# Patient Record
Sex: Female | Born: 1985 | Race: Black or African American | Hispanic: No | Marital: Single | Smoking: Never smoker
Health system: Southern US, Community
[De-identification: ages and names within clinical notes are randomized; demographics above are authoritative.]

## PROBLEM LIST (undated history)

## (undated) DIAGNOSIS — N390 Urinary tract infection, site not specified: Secondary | ICD-10-CM

## (undated) DIAGNOSIS — N730 Acute parametritis and pelvic cellulitis: Secondary | ICD-10-CM

## (undated) DIAGNOSIS — A6920 Lyme disease, unspecified: Secondary | ICD-10-CM

---

## 2009-12-01 ENCOUNTER — Encounter: Admission: RE | Admit: 2009-12-01 | Discharge: 2009-12-01 | Payer: Self-pay | Admitting: Internal Medicine

## 2012-03-26 ENCOUNTER — Encounter (HOSPITAL_COMMUNITY): Payer: Self-pay | Admitting: Emergency Medicine

## 2012-03-26 ENCOUNTER — Emergency Department (HOSPITAL_COMMUNITY)
Admission: EM | Admit: 2012-03-26 | Discharge: 2012-03-26 | Disposition: A | Discharge status: Home / Self Care | Payer: Self-pay | Attending: Emergency Medicine | Admitting: Emergency Medicine

## 2012-03-26 DIAGNOSIS — B9689 Other specified bacterial agents as the cause of diseases classified elsewhere: Secondary | ICD-10-CM | POA: Insufficient documentation

## 2012-03-26 DIAGNOSIS — N760 Acute vaginitis: Secondary | ICD-10-CM | POA: Insufficient documentation

## 2012-03-26 DIAGNOSIS — A499 Bacterial infection, unspecified: Secondary | ICD-10-CM | POA: Insufficient documentation

## 2012-03-26 LAB — URINALYSIS, ROUTINE W REFLEX MICROSCOPIC
Bilirubin Urine: NEGATIVE
Glucose, UA: NEGATIVE mg/dL
Hgb urine dipstick: NEGATIVE
Ketones, ur: NEGATIVE mg/dL
Leukocytes, UA: NEGATIVE
Nitrite: NEGATIVE
Protein, ur: 100 mg/dL — AB
Specific Gravity, Urine: 1.027 (ref 1.005–1.030)
Urobilinogen, UA: 1 mg/dL (ref 0.0–1.0)
pH: 7 (ref 5.0–8.0)

## 2012-03-26 LAB — URINE MICROSCOPIC-ADD ON

## 2012-03-26 LAB — POCT PREGNANCY, URINE: Preg Test, Ur: NEGATIVE

## 2012-03-26 LAB — WET PREP, GENITAL

## 2012-03-26 MED ORDER — METRONIDAZOLE 500 MG PO TABS: 500.0000 mg | ORAL_TABLET | Freq: Two times a day (BID) | ORAL | Status: AC

## 2012-03-26 NOTE — ED Notes (Signed)
Pt presenting to ed with c/o vaginal discharge x 2 weeks pt states clear discharge. Pt state she thinks she has had a fever due to having chills and night sweats. Pt states she has positive abdominal pain. Pt states positive nausea and no vomiting at this time

## 2012-03-26 NOTE — ED Provider Notes (Signed)
History     CSN: [id number]  Arrival date & time 03/26/12  0903   First MD Initiated Contact with Patient 03/26/12 0918      10:04 AM HPI Reports 1 week of heavy clear vaginal discharge. States symptoms began after having intercourse with her boyfriend. Denies itching, odor, bleeding or urinary symptoms. Reports intermitten low abdominal pain and back pain. Denies pain currently.   Patient is a 26 y.o. female presenting with vaginal discharge.  Vaginal Discharge This is a new problem. The current episode started in the past 7 days. The problem occurs constantly. The problem has been unchanged. Pertinent negatives include no abdominal pain, chest pain, chills, fever, headaches, nausea, neck pain, numbness, rash, sore throat, urinary symptoms, vomiting or weakness.    History reviewed. No pertinent past medical history.  History reviewed. No pertinent past surgical history.  No family history on file.  History  Substance Use Topics  . Smoking status: Never Smoker   . Smokeless tobacco: Not on file  . Alcohol Use: No    OB History    Grav Para Term Preterm Abortions TAB SAB Ect Mult Living                  Review of Systems  Constitutional: Negative for fever and chills.  HENT: Negative for sore throat and neck pain.   Respiratory: Negative for shortness of breath.   Cardiovascular: Negative for chest pain.  Gastrointestinal: Negative for nausea, vomiting, abdominal pain, diarrhea and constipation.  Genitourinary: Positive for vaginal discharge. Negative for dysuria, urgency, frequency, hematuria, flank pain, vaginal bleeding and vaginal pain.  Musculoskeletal: Negative for back pain.  Skin: Negative for rash.  Neurological: Negative for weakness, numbness and headaches.  All other systems reviewed and are negative.    Allergies  Review of patient's allergies indicates no known allergies.  Home Medications   Current Outpatient Rx  Name Route Sig Dispense Refill    . IBUPROFEN 200 MG PO TABS Oral Take 200 mg by mouth every 6 (six) hours as needed. pain    . ADULT MULTIVITAMIN W/MINERALS CH Oral Take 1 tablet by mouth daily.      BP 125/86  Pulse 98  Temp(Src) 99.4 F (37.4 C) (Oral)  Resp 15  Ht 5\' 3"  (1.6 m)  Wt 157 lb (71.215 kg)  BMI 27.81 kg/m2  SpO2 100%  LMP 02/16/2012  Physical Exam  Vitals reviewed. Constitutional: She is oriented to person, place, and time. Vital signs are normal. She appears well-developed and well-nourished.  HENT:  Head: Normocephalic and atraumatic.  Eyes: Conjunctivae are normal. Pupils are equal, round, and reactive to light.  Neck: Normal range of motion. Neck supple.  Cardiovascular: Normal rate, regular rhythm and normal heart sounds.  Exam reveals no friction rub.   No murmur heard. Pulmonary/Chest: Effort normal and breath sounds normal. She has no wheezes. She has no rhonchi. She has no rales. She exhibits no tenderness.  Abdominal: Normal appearance and bowel sounds are normal. She exhibits no distension and no mass. There is no hepatosplenomegaly. There is no tenderness. There is no rigidity, no rebound, no guarding, no CVA tenderness, no tenderness at McBurney's point and negative Murphy's sign.  Genitourinary: There is no tenderness or lesion on the right labia. There is no tenderness or lesion on the left labia. Uterus is not tender. Cervix exhibits no motion tenderness, no discharge and no friability. Right adnexum displays no tenderness and no fullness. Left adnexum displays no tenderness and  no fullness. No tenderness or bleeding around the vagina. Vaginal discharge found.    Musculoskeletal: Normal range of motion.  Neurological: She is alert and oriented to person, place, and time.  Skin: Skin is warm and dry. No rash noted. No erythema. No pallor.  Psychiatric: She has a normal mood and affect. Her behavior is normal.    ED Course  Procedures Results for orders placed during the hospital  encounter of 03/26/12  URINALYSIS, ROUTINE W REFLEX MICROSCOPIC      Component Value Range   Color, Urine YELLOW  YELLOW    APPearance CLEAR  CLEAR    Specific Gravity, Urine 1.027  1.005 - 1.030    pH 7.0  5.0 - 8.0    Glucose, UA NEGATIVE  NEGATIVE (mg/dL)   Hgb urine dipstick NEGATIVE  NEGATIVE    Bilirubin Urine NEGATIVE  NEGATIVE    Ketones, ur NEGATIVE  NEGATIVE (mg/dL)   Protein, ur 628 (*) NEGATIVE (mg/dL)   Urobilinogen, UA 1.0  0.0 - 1.0 (mg/dL)   Nitrite NEGATIVE  NEGATIVE    Leukocytes, UA NEGATIVE  NEGATIVE   WET PREP, GENITAL      Component Value Range   Yeast Wet Prep HPF POC NONE SEEN  NONE SEEN    Trich, Wet Prep NONE SEEN  NONE SEEN    Clue Cells Wet Prep HPF POC FEW (*) NONE SEEN    WBC, Wet Prep HPF POC FEW (*) NONE SEEN   POCT PREGNANCY, URINE      Component Value Range   Preg Test, Ur NEGATIVE  NEGATIVE   URINE MICROSCOPIC-ADD ON      Component Value Range   Squamous Epithelial / LPF MANY (*) RARE    WBC, UA 0-2  <3 (WBC/hpf)   RBC / HPF 0-2  <3 (RBC/hpf)   Bacteria, UA FEW (*) RARE    Urine-Other MUCOUS PRESENT      MDM  11:10 AM Discussed diagnosis of bacterial vaginosis. Will treat with Flagyl. Advised patient should also follow up with OB/GYN due to small lesion on cervix. Patient voices understanding and is ready for discharge.        Thomasene Lot, PA-C 03/26/12 1111

## 2012-03-26 NOTE — Discharge Instructions (Signed)
Bacterial Vaginosis Bacterial vaginosis (BV) is a vaginal infection where the normal balance of bacteria in the vagina is disrupted. The normal balance is then replaced by an overgrowth of certain bacteria. There are several different kinds of bacteria that can cause BV. BV is the most common vaginal infection in women of childbearing age. CAUSES   The cause of BV is not fully understood. BV develops when there is an increase or imbalance of harmful bacteria.   Some activities or behaviors can upset the normal balance of bacteria in the vagina and put women at increased risk including:   Having a new sex partner or multiple sex partners.   Douching.   Using an intrauterine device (IUD) for contraception.   It is not clear what role sexual activity plays in the development of BV. However, women that have never had sexual intercourse are rarely infected with BV.  Women do not get BV from toilet seats, bedding, swimming pools or from touching objects around them.  SYMPTOMS   Grey vaginal discharge.   A fish-like odor with discharge, especially after sexual intercourse.   Itching or burning of the vagina and vulva.   Burning or pain with urination.   Some women have no signs or symptoms at all.  DIAGNOSIS  Your caregiver must examine the vagina for signs of BV. Your caregiver will perform lab tests and look at the sample of vaginal fluid through a microscope. They will look for bacteria and abnormal cells (clue cells), a pH test higher than 4.5, and a positive amine test all associated with BV.  RISKS AND COMPLICATIONS   Pelvic inflammatory disease (PID).   Infections following gynecology surgery.   Developing HIV.   Developing herpes virus.  TREATMENT  Sometimes BV will clear up without treatment. However, all women with symptoms of BV should be treated to avoid complications, especially if gynecology surgery is planned. Female partners generally do not need to be treated. However,  BV may spread between female sex partners so treatment is helpful in preventing a recurrence of BV.   BV may be treated with antibiotics. The antibiotics come in either pill or vaginal cream forms. Either can be used with nonpregnant or pregnant women, but the recommended dosages differ. These antibiotics are not harmful to the baby.   BV can recur after treatment. If this happens, a second round of antibiotics will often be prescribed.   Treatment is important for pregnant women. If not treated, BV can cause a premature delivery, especially for a pregnant woman who had a premature birth in the past. All pregnant women who have symptoms of BV should be checked and treated.   For chronic reoccurrence of BV, treatment with a type of prescribed gel vaginally twice a week is helpful.  HOME CARE INSTRUCTIONS   Finish all medication as directed by your caregiver.   Do not have sex until treatment is completed.   Tell your sexual partner that you have a vaginal infection. They should see their caregiver and be treated if they have problems, such as a mild rash or itching.   Practice safe sex. Use condoms. Only have 1 sex partner.  PREVENTION  Basic prevention steps can help reduce the risk of upsetting the natural balance of bacteria in the vagina and developing BV:  Do not have sexual intercourse (be abstinent).   Do not douche.   Use all of the medicine prescribed for treatment of BV, even if the signs and symptoms go away.     Tell your sex partner if you have BV. That way, they can be treated, if needed, to prevent reoccurrence.  SEEK MEDICAL CARE IF:   Your symptoms are not improving after 3 days of treatment.   You have increased discharge, pain, or fever.  MAKE SURE YOU:   Understand these instructions.   Will watch your condition.   Will get help right away if you are not doing well or get worse.  FOR MORE INFORMATION  Division of STD Prevention (DSTDP), Centers for Disease  Control and Prevention: www.cdc.gov/std American [employer] Health Association (ASHA): www.ashastd.org  Document Released: 10/28/2005 Document Revised: 10/17/2011 Document Reviewed: 04/20/2009 [employer] Patient Information 2012 [employer]. 

## 2012-03-26 NOTE — ED Provider Notes (Signed)
Medical screening examination/treatment/procedure(s) were performed by non-physician practitioner and as supervising physician I was immediately available for consultation/collaboration.  Trek Kimball, MD 03/26/12 1556 

## 2012-03-27 LAB — GC/CHLAMYDIA PROBE AMP, GENITAL
Chlamydia, DNA Probe: NEGATIVE
GC Probe Amp, Genital: NEGATIVE

## 2013-09-03 ENCOUNTER — Encounter (HOSPITAL_COMMUNITY): Payer: Self-pay | Admitting: Emergency Medicine

## 2013-09-03 ENCOUNTER — Emergency Department (HOSPITAL_COMMUNITY)
Admission: EM | Admit: 2013-09-03 | Discharge: 2013-09-03 | Disposition: A | Discharge status: Home / Self Care | Payer: Self-pay | Attending: Emergency Medicine | Admitting: Emergency Medicine

## 2013-09-03 DIAGNOSIS — R825 Elevated urine levels of drugs, medicaments and biological substances: Secondary | ICD-10-CM

## 2013-09-03 DIAGNOSIS — R892 Abnormal level of other drugs, medicaments and biological substances in specimens from other organs, systems and tissues: Secondary | ICD-10-CM | POA: Insufficient documentation

## 2013-09-03 NOTE — ED Notes (Signed)
Pt left with out being discharged.

## 2013-09-03 NOTE — ED Provider Notes (Signed)
CSN: [id number]     Arrival date & time 09/03/13  1226 History  This chart was scribed for Emilia Beck, PA working with Suzi Roots, MD by Quintella Reichert, ED Scribe. This patient was seen in room WTR7/WTR7 and the patient's care was started at 1:09 PM.  Chief Complaint  Patient presents with  . urine drug screen     The history is provided by the patient. No language interpreter was used.    HPI Comments: Marie Howell is a 27 y.o. female who presents to the Emergency Department with a chief complaint of abnormal lab results.  Pt states she received a urine drug screen when applying for a new job recently and today she was informed that "it tested positive for morphine, and I don't know how.  I didn't even know what morphine was."  She came to the ED because she is concerned why this may have been present in her urine.  She does not take any medications regularly and denies illicit drug use.  She denies h/o surgeries.  She notes that when she received a separate drug screen several weeks ago it was normal.  Pt has no other complaints at this time.    History reviewed. No pertinent past medical history.  History reviewed. No pertinent past surgical history.  No family history on file.   History  Substance Use Topics  . Smoking status: Never Smoker   . Smokeless tobacco: Not on file  . Alcohol Use: No    OB History   Grav Para Term Preterm Abortions TAB SAB Ect Mult Living                  Review of Systems  All other systems reviewed and are negative.     Allergies  Review of patient's allergies indicates no known allergies.  Home Medications   Current Outpatient Rx  Name  Route  Sig  Dispense  Refill  . levonorgestrel (PLAN B 1-STEP) 1.5 MG tablet   Oral   Take 1 tablet by mouth once.         . Multiple Vitamin (MULITIVITAMIN WITH MINERALS) TABS   Oral   Take 1 tablet by mouth daily.          BP 123/73  Pulse 73  Temp(Src) 99.4 F (37.4  C) (Oral)  Resp 16  SpO2 99%  Physical Exam  Nursing note and vitals reviewed. Constitutional: She is oriented to person, place, and time. She appears well-developed and well-nourished. No distress.  HENT:  Head: Normocephalic and atraumatic.  Eyes: EOM are normal.  Neck: Neck supple. No tracheal deviation present.  Cardiovascular: Normal rate.   Pulmonary/Chest: Effort normal. No respiratory distress.  Musculoskeletal: Normal range of motion.  Neurological: She is alert and oriented to person, place, and time.  Skin: Skin is warm and dry.  Psychiatric: She has a normal mood and affect. Her behavior is normal.    ED Course  Procedures (including critical care time)  DIAGNOSTIC STUDIES: Oxygen Saturation is 99% on room air, normal by my interpretation.    COORDINATION OF CARE: 1:14 PM-Informed pt that a drug screen for morphine  willnot be performed in the ED and advised f/u at another clinic.  Pt expressed understanding and agreed with plan.   Labs Review Labs Reviewed - No data to display  Imaging Review No results found.  EKG Interpretation   None       MDM   1. Positive urine drug  screen    1:16 PM Patient wants to known how it is possible for her to have a positive urine drug screen at her employer office. Patient denies drug use. Patient instructed to go to the Health Department as needed. No further evaluation needed at this time.     I personally performed the services described in this documentation, which was scribed in my presence. The recorded information has been reviewed and is accurate.   Emilia Beck, PA-C 09/03/13 1317

## 2013-09-07 NOTE — ED Provider Notes (Signed)
Medical screening examination/treatment/procedure(s) were performed by non-physician practitioner and as supervising physician I was immediately available for consultation/collaboration.  EKG Interpretation   None         Suzi Roots, MD 09/07/13 1252

## 2013-09-17 ENCOUNTER — Emergency Department (HOSPITAL_COMMUNITY)
Admission: EM | Admit: 2013-09-17 | Discharge: 2013-09-18 | Disposition: A | Discharge status: Home / Self Care | Payer: Self-pay | Attending: Emergency Medicine | Admitting: Emergency Medicine

## 2013-09-17 ENCOUNTER — Encounter (HOSPITAL_COMMUNITY): Payer: Self-pay | Admitting: Emergency Medicine

## 2013-09-17 DIAGNOSIS — Z3202 Encounter for pregnancy test, result negative: Secondary | ICD-10-CM | POA: Insufficient documentation

## 2013-09-17 DIAGNOSIS — A6009 Herpesviral infection of other urogenital tract: Secondary | ICD-10-CM

## 2013-09-17 DIAGNOSIS — A6000 Herpesviral infection of urogenital system, unspecified: Secondary | ICD-10-CM | POA: Insufficient documentation

## 2013-09-17 NOTE — ED Notes (Signed)
Pt states she has had a bumpy rash intermittently over the last 4 years.  Pt reports being tested for herpes during one of the breakouts however she was told by the medical provider that she was "clear"  Pt states she presented to the ED tonight d/t  Burning in her vagina after urination as well as during shower x 2 weeks.  Pt rates the pain 4/10 however pain increases to 10/10 while wiping after urination.

## 2013-09-17 NOTE — ED Provider Notes (Signed)
CSN: [id number]     Arrival date & time 09/17/13  2301 History   First MD Initiated Contact with Patient 09/17/13 2338     Chief Complaint  Patient presents with  . Pelvic Pain   (Consider location/radiation/quality/duration/timing/severity/associated sxs/prior Treatment) HPI  27 year old female presents complaining of pelvic pain and vaginal discharge. Patient reports intermittent pelvic discomfort, complaining of a mild aching sensation to her low abdomen and having burning sensation whenever she wipes. Her boyfriend noticed that she has a rash in her vagina and she also noticed a mild small white vaginal discharge. She also reports having pain with sexual activities for the past 2 weeks as well. Denies any fever, chills, chest pain, shortness of breath, back pain, nausea, vomiting, diarrhea, dysuria, hematuria, hematochezia or melena. Did have prior history of Chlamydia and boyfriend had a remote history of gonorrhea at the age of 26. Patient reports she has intermittent burning sensation when she wipes ongoing for the past 4 years, has been seen and evaluated for this complaint several times in the past without specific diagnosis. At one point it was thought that she may have herpes however states a test was performed that shows no evidence of herpes  History reviewed. No pertinent past medical history. History reviewed. No pertinent past surgical history. History reviewed. No pertinent family history. History  Substance Use Topics  . Smoking status: Never Smoker   . Smokeless tobacco: Not on file  . Alcohol Use: No   OB History   Grav Para Term Preterm Abortions TAB SAB Ect Mult Living                 Review of Systems  All other systems reviewed and are negative.    Allergies  Review of patient's allergies indicates no known allergies.  Home Medications   Current Outpatient Rx  Name  Route  Sig  Dispense  Refill  . levonorgestrel (PLAN B 1-STEP) 1.5 MG tablet   Oral  Take 1 tablet by mouth once.         . Multiple Vitamin (MULITIVITAMIN WITH MINERALS) TABS   Oral   Take 1 tablet by mouth daily.          BP 130/83  Pulse 77  Temp(Src) 98 F (36.7 C) (Oral)  Resp 16  SpO2 100%  LMP 09/06/2013 Physical Exam  Nursing note and vitals reviewed. Constitutional: She appears well-developed and well-nourished. No distress.  HENT:  Head: Normocephalic and atraumatic.  Eyes: Conjunctivae are normal.  Neck: Normal range of motion. Neck supple.  Cardiovascular: Normal rate and regular rhythm.   Pulmonary/Chest: Effort normal and breath sounds normal. She exhibits no tenderness.  Abdominal: Soft. There is no tenderness. There is no rebound and no guarding.  Genitourinary: Vagina normal and uterus normal.    There is no rash or lesion on the right labia. There is no rash or lesion on the left labia. Cervix exhibits no motion tenderness and no discharge. Right adnexum displays no mass and no tenderness. Left adnexum displays no mass and no tenderness. No erythema, tenderness or bleeding around the vagina. No vaginal discharge found.  Chaperone present:    Lymphadenopathy:       Right: No inguinal adenopathy present.       Left: No inguinal adenopathy present.  Neurological: She is alert.  Skin: Skin is warm.  Psychiatric: She has a normal mood and affect.    ED Course  Procedures (including critical care time)  12:42 AM Pt  has a group of small pustular vesicle to her R lower labia minora, ttp.  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  Otherwise pelvic examination unremarkable.  No abscess.  Plan to treat with valtrex.  GC/Ch cultures sent.  Rocephin/zithromax option offer, pt declined.    12:59 AM Wet prep unremarkable.  No reproducible abdominal pain on exam.  Will treat for suspected herpes with Valtrex.  Cultures sent.    Labs Review Labs Reviewed  WET PREP, GENITAL - Abnormal; Notable for the following:    Clue Cells Wet Prep HPF POC RARE (*)    WBC,  Wet Prep HPF POC FEW (*)    All other components within normal limits  GC/CHLAMYDIA PROBE AMP  URINALYSIS, ROUTINE W REFLEX MICROSCOPIC  POCT PREGNANCY, URINE   Imaging Review No results found.  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

## 2013-09-17 NOTE — ED Notes (Signed)
Pt arrived to the ED with a complaint of pelvic pain. Pt states she had " break outs" of burning.  Pt states she has associated lower abdominal pain as well.  Pt states she has a small amount of white discharge as well.

## 2013-09-18 LAB — URINALYSIS, ROUTINE W REFLEX MICROSCOPIC
Glucose, UA: NEGATIVE mg/dL
Ketones, ur: NEGATIVE mg/dL
Nitrite: NEGATIVE
Protein, ur: NEGATIVE mg/dL
Specific Gravity, Urine: 1.033 — ABNORMAL HIGH (ref 1.005–1.030)
Urobilinogen, UA: 1 mg/dL (ref 0.0–1.0)

## 2013-09-18 LAB — WET PREP, GENITAL
Trich, Wet Prep: NONE SEEN
Yeast Wet Prep HPF POC: NONE SEEN

## 2013-09-18 LAB — URINE MICROSCOPIC-ADD ON

## 2013-09-18 LAB — POCT PREGNANCY, URINE: Preg Test, Ur: NEGATIVE

## 2013-09-18 LAB — GC/CHLAMYDIA PROBE AMP
CT Probe RNA: NEGATIVE
GC Probe RNA: NEGATIVE

## 2013-09-18 MED ORDER — VALACYCLOVIR HCL 1 G PO TABS: 1000.0000 mg | ORAL_TABLET | Freq: Two times a day (BID) | ORAL | Status: AC

## 2013-09-19 LAB — URINE CULTURE

## 2013-09-19 NOTE — ED Provider Notes (Signed)
Medical screening examination/treatment/procedure(s) were performed by non-physician practitioner and as supervising physician I was immediately available for consultation/collaboration.   Kassity Woodson, MD 09/19/13 0715 

## 2014-05-15 ENCOUNTER — Emergency Department (HOSPITAL_COMMUNITY)
Admission: EM | Admit: 2014-05-15 | Discharge: 2014-05-16 | Disposition: A | Discharge status: Home / Self Care | Payer: Self-pay | Attending: Emergency Medicine | Admitting: Emergency Medicine

## 2014-05-15 ENCOUNTER — Encounter (HOSPITAL_COMMUNITY): Payer: Self-pay | Admitting: Emergency Medicine

## 2014-05-15 DIAGNOSIS — N730 Acute parametritis and pelvic cellulitis: Secondary | ICD-10-CM

## 2014-05-15 DIAGNOSIS — Z79899 Other long term (current) drug therapy: Secondary | ICD-10-CM | POA: Insufficient documentation

## 2014-05-15 DIAGNOSIS — Z711 Person with feared health complaint in whom no diagnosis is made: Secondary | ICD-10-CM

## 2014-05-15 DIAGNOSIS — R509 Fever, unspecified: Secondary | ICD-10-CM | POA: Insufficient documentation

## 2014-05-15 DIAGNOSIS — Z3202 Encounter for pregnancy test, result negative: Secondary | ICD-10-CM | POA: Insufficient documentation

## 2014-05-15 DIAGNOSIS — R109 Unspecified abdominal pain: Secondary | ICD-10-CM | POA: Insufficient documentation

## 2014-05-15 DIAGNOSIS — N739 Female pelvic inflammatory disease, unspecified: Secondary | ICD-10-CM | POA: Insufficient documentation

## 2014-05-15 DIAGNOSIS — Z113 Encounter for screening for infections with a predominantly sexual mode of transmission: Secondary | ICD-10-CM | POA: Insufficient documentation

## 2014-05-15 DIAGNOSIS — N390 Urinary tract infection, site not specified: Secondary | ICD-10-CM | POA: Insufficient documentation

## 2014-05-15 HISTORY — DX: Urinary tract infection, site not specified: N39.0

## 2014-05-15 LAB — URINALYSIS, ROUTINE W REFLEX MICROSCOPIC
BILIRUBIN URINE: NEGATIVE
GLUCOSE, UA: NEGATIVE mg/dL
Ketones, ur: NEGATIVE mg/dL
Nitrite: NEGATIVE
PH: 8 (ref 5.0–8.0)
Protein, ur: 30 mg/dL — AB
SPECIFIC GRAVITY, URINE: 1.03 (ref 1.005–1.030)
UROBILINOGEN UA: 1 mg/dL (ref 0.0–1.0)

## 2014-05-15 LAB — POC URINE PREG, ED: PREG TEST UR: NEGATIVE

## 2014-05-15 LAB — URINE MICROSCOPIC-ADD ON

## 2014-05-15 MED ORDER — SODIUM CHLORIDE 0.9 % IV BOLUS (SEPSIS)
1000.0000 mL | Freq: Once | INTRAVENOUS | Status: AC
  Administered 2014-05-15: 1000 mL via INTRAVENOUS

## 2014-05-15 MED ORDER — ACETAMINOPHEN 500 MG PO TABS
500.0000 mg | ORAL_TABLET | Freq: Once | ORAL | Status: AC
  Administered 2014-05-16: 500 mg via ORAL
  Filled 2014-05-15: qty 1

## 2014-05-15 NOTE — ED Provider Notes (Signed)
CSN: [id number]     Arrival date & time 05/15/14  2143 History   First MD Initiated Contact with Patient 05/15/14 2238     Chief Complaint  Patient presents with  . Fever  . Urinary Tract Infection  . Vaginal Discharge  . Abdominal Pain     (Consider location/radiation/quality/duration/timing/severity/associated sxs/prior Treatment) HPI Comments: Marie Howell is a 28 y.o. Female with no significant PMHx presenting today with fever and intermittent sharp lower abd pain which is now resolved. Pt states 2 wks ago she had intercourse, then 3 days ago she noticed vaginal itching and white d/c. Used OTC yeast meds and the d/c resolved, but she started her menses the next day (yesterday). Due to her menses, she is unsure if she's having any further discharge. States at the same time as the d/c developed, she noticed sharp intermittent nonradiating lower abd pain, which she's not sure of what brings it on but it resolves shortly thereafter, and is currently not present. Denies dysuria, hematuria, back/flank pain, diarrhea, constipation, HA, confusion, CP, SOB, cough, myalgias or arthralgias. Denies rashes. States she had a sore throat last week but is no longer having a sore throat. Denies lymph node swelling or neck pain. No sick contacts at home. Has not tried anything for fever or abd pain.  Denies dyspareunia during intercourse two weeks ago.  Patient is a 28 y.o. female presenting with fever. The history is provided by the patient. No language interpreter was used.  Fever Temp source:  Subjective Severity:  Mild Onset quality:  Unable to specify Timing:  Unable to specify Progression:  Unchanged Chronicity:  New Relieved by:  None tried Worsened by:  Nothing tried Ineffective treatments:  None tried Associated symptoms: no chest pain, no chills, no confusion, no cough, no diarrhea, no dysuria, no ear pain, no headaches, no myalgias, no nausea, no rash, no rhinorrhea, no sore throat and no  vomiting   Risk factors: no recent travel and no sick contacts     Past Medical History  Diagnosis Date  . UTI (lower urinary tract infection)    History reviewed. No pertinent past surgical history. No family history on file. History  Substance Use Topics  . Smoking status: Never Smoker   . Smokeless tobacco: Not on file  . Alcohol Use: No   OB History   Grav Para Term Preterm Abortions TAB SAB Ect Mult Living                 Review of Systems  Constitutional: Positive for fever. Negative for chills.  HENT: Negative for ear pain, rhinorrhea and sore throat.   Eyes: Negative for pain and visual disturbance.  Respiratory: Negative for cough, shortness of breath and wheezing.   Cardiovascular: Negative for chest pain.  Gastrointestinal: Positive for abdominal pain (intermittent, now resolved). Negative for nausea, vomiting, diarrhea, constipation and blood in stool.  Genitourinary: Positive for vaginal bleeding (on menses) and vaginal discharge (unsure). Negative for dysuria, urgency, hematuria, flank pain, decreased urine volume, vaginal pain, pelvic pain and dyspareunia.  Musculoskeletal: Negative for myalgias.  Skin: Negative for rash.  Neurological: Negative for dizziness, syncope, weakness, light-headedness, numbness and headaches.  Psychiatric/Behavioral: Negative for confusion.  10 Systems reviewed and are negative for acute change except as noted in the HPI.     Allergies  Review of patient's allergies indicates no known allergies.  Home Medications   Prior to Admission medications   Medication Sig Start Date End Date Taking? Authorizing Provider  doxycycline (VIBRAMYCIN) 100 MG capsule Take 1 capsule (100 mg total) by mouth 2 (two) times daily. 05/16/14   Loann Chahal Strupp Camprubi-Soms, PA-C   BP 129/78  Pulse 74  Temp(Src) 99.6 F (37.6 C) (Oral)  Resp 16  Ht 5\' 3"  (1.6 m)  Wt 164 lb (74.39 kg)  BMI 29.06 kg/m2  SpO2 100%  LMP 05/11/2014 Physical Exam   Nursing note and vitals reviewed. Constitutional: She is oriented to person, place, and time. She appears well-developed and well-nourished.  Non-toxic appearance. No distress.  Temp 100.72F, non-toxic, in NAD  HENT:  Head: Normocephalic and atraumatic.  Right Ear: Hearing, tympanic membrane and external ear normal.  Left Ear: Tympanic membrane normal.  Nose: Nose normal.  Mouth/Throat: Uvula is midline, oropharynx is clear and moist and mucous membranes are normal.  [place]/AT, TMs and canals free of erythema, nasal mucosal free of erythema or edema, post oropharynx free of exudates, edema, or erythema. MMM  Eyes: Conjunctivae and EOM are normal. Pupils are equal, round, and reactive to light. Right eye exhibits no discharge. Left eye exhibits no discharge.  Neck: Normal range of motion. Neck supple.  Cardiovascular: Normal rate, regular rhythm, normal heart sounds and intact distal pulses.   No murmur heard. Pulmonary/Chest: Effort normal and breath sounds normal. No respiratory distress. She has no wheezes. She has no rales.  CTAB  Abdominal: Soft. Normal appearance and bowel sounds are normal. She exhibits no distension. There is no tenderness. There is no rigidity, no rebound, no guarding, no CVA tenderness, no tenderness at McBurney's point and negative Murphy's sign.  Soft, NT/ND, no r/g/r, neg McBurneys, neg Murphy's  Genitourinary: Uterus normal. There is no rash, tenderness or lesion on the right labia. There is no rash, tenderness or lesion on the left labia. Uterus is not deviated, not enlarged, not fixed and not tender. Cervix exhibits motion tenderness, discharge and friability. Right adnexum displays no mass, no tenderness and no fullness. Left adnexum displays no mass, no tenderness and no fullness. There is bleeding around the vagina. Vaginal discharge found.  Old blood noted in vaginal vault, minimal mucoid discharge noted within vaginal vault, at cervix. Cervix friable with punctate  hemorrhages noted, mild mucoid discharge at os. Mild CMT noted. Uterus non-fixated, non-enlarged. Clear mucous present at external urethral meatus  Musculoskeletal: Normal range of motion.  Neurological: She is alert and oriented to person, place, and time.  Skin: Skin is warm, dry and intact. No rash noted.  No rashes  Psychiatric: She has a normal mood and affect.    ED Course  Procedures (including critical care time) Labs Review Labs Reviewed  WET PREP, GENITAL - Abnormal; Notable for the following:    WBC, Wet Prep HPF POC MODERATE (*)    All other components within normal limits  URINALYSIS, ROUTINE W REFLEX MICROSCOPIC - Abnormal; Notable for the following:    APPearance CLOUDY (*)    Hgb urine dipstick LARGE (*)    Protein, ur 30 (*)    Leukocytes, UA TRACE (*)    All other components within normal limits  URINE MICROSCOPIC-ADD ON - Abnormal; Notable for the following:    Bacteria, UA FEW (*)    All other components within normal limits  COMPREHENSIVE METABOLIC PANEL - Abnormal; Notable for the following:    Sodium 134 (*)    Albumin 3.3 (*)    AST 55 (*)    Total Bilirubin <0.2 (*)    All other components within normal limits  GC/CHLAMYDIA PROBE AMP  CBC WITH DIFFERENTIAL  POC URINE PREG, ED    Imaging Review No results found.   EKG Interpretation None      MDM   Final diagnoses:  Fever, unspecified fever cause  PID (acute pelvic inflammatory disease)  Concern about STD in female without diagnosis    Marie Howell is a 28 y.o. female presenting with a subjective fever at home, and vaginal d/c 3 days ago but is unsure if it's ongoing, as well as intermittent lower abd pain that is resolved at this time. No dysuria or hematuria. No back pain. Abd exam benign. Will obtain labs and start fluids. Will give Tylenol now. Given lack of focus for fever sources, DDx includes pelvic infectious etiology or STDs. DDx for fever also includes viral etiologies, but  again pt doesn't have any focal source at this time aside from vague vaginal/pelvic complaints that have mostly resolved.  11:30PM Vaginal exam suspicious for trich, mild CMT could represent PID, no TOA palpated bilaterally. Mucous present at urethral meatus during vaginal exam suspicious for GC/CT. Fever down with Tylenol, now 99.71F. U/A unremarkable for UTI, demonstrates large Hgb likely related to menses nitrite neg and trace leuks with few bacteria and mucous present. CBC with diff and CMP unremarkable. Wet prep neg for yeast, trich, and BV but demonstrates moderate WBCs. Will tx for GC/CT and PID and have pt f/up in women's outpt clinic. I explained the diagnosis and have given explicit precautions to return to the ER including for any other new or worsening symptoms. The patient understands and accepts the medical plan as it's been dictated and I have answered their questions. Discharge instructions concerning home care and prescriptions have been given. The patient is STABLE and is discharged to home in good condition.  BP 129/78  Pulse 74  Temp(Src) 99.6 F (37.6 C) (Oral)  Resp 16  Ht 5\' 3"  (1.6 m)  Wt 164 lb (74.39 kg)  BMI 29.06 kg/m2  SpO2 100%  LMP 05/11/2014   [place] Camprubi-Soms, PA-C 05/16/14 [phone]

## 2014-05-15 NOTE — ED Notes (Signed)
Pt presents with NAD- pt c/o yeast infection and treated with OTA meds. Then c/o of vaginal discharged that has since left. Pt c/o of lower stomach pain. Denies N/V/D. C/o of fever. Denies other urinary problems

## 2014-05-16 ENCOUNTER — Telehealth (HOSPITAL_BASED_OUTPATIENT_CLINIC_OR_DEPARTMENT_OTHER): Payer: Self-pay

## 2014-05-16 LAB — CBC WITH DIFFERENTIAL/PLATELET
Basophils Absolute: 0 10*3/uL (ref 0.0–0.1)
Basophils Relative: 0 % (ref 0–1)
EOS PCT: 0 % (ref 0–5)
Eosinophils Absolute: 0 10*3/uL (ref 0.0–0.7)
HCT: 40.7 % (ref 36.0–46.0)
HEMOGLOBIN: 13.6 g/dL (ref 12.0–15.0)
LYMPHS ABS: 1.2 10*3/uL (ref 0.7–4.0)
LYMPHS PCT: 25 % (ref 12–46)
MCH: 28.4 pg (ref 26.0–34.0)
MCHC: 33.4 g/dL (ref 30.0–36.0)
MCV: 85 fL (ref 78.0–100.0)
MONOS PCT: 10 % (ref 3–12)
Monocytes Absolute: 0.5 10*3/uL (ref 0.1–1.0)
NEUTROS PCT: 65 % (ref 43–77)
Neutro Abs: 3 10*3/uL (ref 1.7–7.7)
PLATELETS: 316 10*3/uL (ref 150–400)
RBC: 4.79 MIL/uL (ref 3.87–5.11)
RDW: 14.5 % (ref 11.5–15.5)
WBC: 4.7 10*3/uL (ref 4.0–10.5)

## 2014-05-16 LAB — COMPREHENSIVE METABOLIC PANEL
ALBUMIN: 3.3 g/dL — AB (ref 3.5–5.2)
ALT: 26 U/L (ref 0–35)
AST: 55 U/L — AB (ref 0–37)
Alkaline Phosphatase: 85 U/L (ref 39–117)
Anion gap: 12 (ref 5–15)
BUN: 7 mg/dL (ref 6–23)
CALCIUM: 9.1 mg/dL (ref 8.4–10.5)
CHLORIDE: 99 meq/L (ref 96–112)
CO2: 23 mEq/L (ref 19–32)
CREATININE: 0.69 mg/dL (ref 0.50–1.10)
GFR calc Af Amer: >90 mL/min (ref >90)
GFR calc non Af Amer: >90 mL/min (ref >90)
Glucose, Bld: 85 mg/dL (ref 70–99)
Potassium: 4.9 mEq/L (ref 3.7–5.3)
Sodium: 134 mEq/L — ABNORMAL LOW (ref 137–147)
Total Bilirubin: <0.2 mg/dL — ABNORMAL LOW (ref 0.3–1.2)
Total Protein: 7.9 g/dL (ref 6.0–8.3)

## 2014-05-16 LAB — WET PREP, GENITAL
Clue Cells Wet Prep HPF POC: NONE SEEN
TRICH WET PREP: NONE SEEN
YEAST WET PREP: NONE SEEN

## 2014-05-16 MED ORDER — CEFTRIAXONE SODIUM 250 MG IJ SOLR
250.0000 mg | Freq: Once | INTRAMUSCULAR | Status: AC
  Administered 2014-05-16: 250 mg via INTRAMUSCULAR
  Filled 2014-05-16: qty 250

## 2014-05-16 MED ORDER — AZITHROMYCIN 250 MG PO TABS
1000.0000 mg | ORAL_TABLET | Freq: Once | ORAL | Status: AC
  Administered 2014-05-16: 1000 mg via ORAL
  Filled 2014-05-16: qty 4

## 2014-05-16 MED ORDER — DOXYCYCLINE HYCLATE 100 MG PO CAPS: 100.0000 mg | ORAL_CAPSULE | Freq: Two times a day (BID) | ORAL | Status: DC

## 2014-05-16 NOTE — ED Notes (Signed)
PA and NT at bedside for pelvic exam 

## 2014-05-16 NOTE — Discharge Instructions (Signed)
[facility] for future STD concerns or screenings. This is the recommendation by the CDC for people with multiple sexual partners or hx of STDs. You have been treated for gonorrhea and chlamydia in the ER but the hospital will call you if lab is positive. You are being treated for a pelvic infection. Take all of the antibiotics prescribed. Use tylenol and motrin alternating for fever/pain. If you develop any changes or worsening symptoms, return to the emergency department.   Fever, Adult A fever is a temperature of 100.4 F (38 C) or above.  HOME CARE  Take fever medicine as told by your doctor. Do not  take aspirin for fever if you are younger than 28 years of age.  If you are given antibiotic medicine, take it as told. Finish the medicine even if you start to feel better.  Rest.  Drink enough fluids to keep your pee (urine) clear or pale yellow. Do not drink alcohol.  Take a bath or shower with room temperature water. Do not use ice water or alcohol sponge baths.  Wear lightweight, loose clothes. GET HELP RIGHT AWAY IF:   You are short of breath or have trouble breathing.  You are very weak.  You are dizzy or you pass out (faint).  You are very thirsty or are making little or no urine.  You have new pain.  You throw up (vomit) or have watery poop (diarrhea).  You keep throwing up or having watery poop for more than 1 to 2 days.  You have a stiff neck or light bothers your eyes.  You have a skin rash.  You have a fever or problems (symptoms) that last for more than 2 to 3 days.  You have a fever and your problems quickly get worse.  You keep throwing up the fluids you drink.  You do not feel better after 3 days.  You have new problems. MAKE SURE YOU:   Understand these instructions.  Will watch your condition.  Will get help right away if you are not doing well or get worse. Document Released: 08/06/2008 Document  Revised: 01/20/2012 Document Reviewed: 08/29/2011 [facility] Patient Information 2015 [place]. This information is not intended to replace advice given to you by your health care provider. Make sure you discuss any questions you have with your health care provider.  Pelvic Inflammatory Disease Pelvic inflammatory disease (PID) is an infection in some or all of the female organs. PID can be in the uterus, ovaries, fallopian tubes, or the surrounding tissues inside the lower belly area (pelvis). HOME CARE   If given, take your antibiotic medicine as told. Finish them even if you start to feel better.  Only take medicine as told by your doctor.  Do not have sex (intercourse) until treatment is done or as told by your doctor.  Tell your sex partner if you have PID. Your partner may need to be treated.  Keep all doctor visits. GET HELP RIGHT AWAY IF:   You have a fever.  You have more belly (abdominal) or lower belly pain.  You have chills.  You have pain when you pee (urinate).  You are not better after 72 hours.  You have more fluid (discharge) coming from your vagina or fluid that is not normal.  You need pain medicine from your doctor.  You throw up (vomit).  You cannot take your medicines.  Your partner has a sexually transmitted disease (STD). MAKE  SURE YOU:   Understand these instructions.  Will watch your condition.  Will get help right away if you are not doing well or get worse. Document Released: 01/24/2009 Document Revised: 02/22/2013 Document Reviewed: 10/24/2011 [facility] Patient Information 2015 [place], [place]. This information is not intended to replace advice given to you by your health care provider. Make sure you discuss any questions you have with your health care provider.

## 2014-05-16 NOTE — Telephone Encounter (Signed)
Pt calling stated pharmacy refusing to fill her Rx.  Pt given FM # to give to pharmacist to clarify what the problem is and pharmacy has not returned call

## 2014-05-16 NOTE — ED Provider Notes (Signed)
Medical screening examination/treatment/procedure(s) were conducted as a shared visit with non-physician practitioner(s) and myself.  I personally evaluated the patient during the encounter.   EKG Interpretation None      I interviewed and examined the patient. Lungs are CTAB. Cardiac exam wnl. Abdomen soft. She has some fleeting lower abd pains, but denies this currently on exam and abd is nontender w/ deep palpation. She had a sexual encounter 2 weeks ago and condom came off. Does have hx of STD's. Found to have temp of 100.4 here. Possibly PID. Doubt TOA given absence of any focal abd pain w/ palpation. PA to perform pelvic. Will tx empirically for STD's and home on doxy to cover for PID. Will rec pt f/u in womens outpt clinic and return for any worsening.   Junius ArgyleForrest S Krish Bailly, MD 05/16/14 2047

## 2014-05-17 ENCOUNTER — Telehealth (HOSPITAL_COMMUNITY): Payer: Self-pay

## 2014-05-17 ENCOUNTER — Emergency Department (HOSPITAL_COMMUNITY)
Admission: EM | Admit: 2014-05-17 | Discharge: 2014-05-17 | Disposition: A | Discharge status: Home / Self Care | Payer: Self-pay | Attending: Emergency Medicine | Admitting: Emergency Medicine

## 2014-05-17 ENCOUNTER — Encounter (HOSPITAL_COMMUNITY): Payer: Self-pay | Admitting: Emergency Medicine

## 2014-05-17 DIAGNOSIS — N949 Unspecified condition associated with female genital organs and menstrual cycle: Secondary | ICD-10-CM | POA: Insufficient documentation

## 2014-05-17 DIAGNOSIS — R51 Headache: Secondary | ICD-10-CM | POA: Insufficient documentation

## 2014-05-17 DIAGNOSIS — R599 Enlarged lymph nodes, unspecified: Secondary | ICD-10-CM | POA: Insufficient documentation

## 2014-05-17 DIAGNOSIS — Z792 Long term (current) use of antibiotics: Secondary | ICD-10-CM | POA: Insufficient documentation

## 2014-05-17 DIAGNOSIS — M791 Myalgia, unspecified site: Secondary | ICD-10-CM

## 2014-05-17 DIAGNOSIS — R509 Fever, unspecified: Secondary | ICD-10-CM | POA: Insufficient documentation

## 2014-05-17 DIAGNOSIS — IMO0001 Reserved for inherently not codable concepts without codable children: Secondary | ICD-10-CM | POA: Insufficient documentation

## 2014-05-17 DIAGNOSIS — Z87440 Personal history of urinary (tract) infections: Secondary | ICD-10-CM | POA: Insufficient documentation

## 2014-05-17 LAB — CBC WITH DIFFERENTIAL/PLATELET
Basophils Absolute: 0 10*3/uL (ref 0.0–0.1)
Basophils Relative: 0 % (ref 0–1)
EOS ABS: 0 10*3/uL (ref 0.0–0.7)
Eosinophils Relative: 0 % (ref 0–5)
HCT: 38.3 % (ref 36.0–46.0)
Hemoglobin: 13 g/dL (ref 12.0–15.0)
Lymphocytes Relative: 25 % (ref 12–46)
Lymphs Abs: 1.4 10*3/uL (ref 0.7–4.0)
MCH: 28.4 pg (ref 26.0–34.0)
MCHC: 33.9 g/dL (ref 30.0–36.0)
MCV: 83.6 fL (ref 78.0–100.0)
MONOS PCT: 10 % (ref 3–12)
Monocytes Absolute: 0.6 10*3/uL (ref 0.1–1.0)
NEUTROS PCT: 65 % (ref 43–77)
Neutro Abs: 3.7 10*3/uL (ref 1.7–7.7)
PLATELETS: 283 10*3/uL (ref 150–400)
RBC: 4.58 MIL/uL (ref 3.87–5.11)
RDW: 14.7 % (ref 11.5–15.5)
WBC: 5.7 10*3/uL (ref 4.0–10.5)

## 2014-05-17 LAB — URINALYSIS, ROUTINE W REFLEX MICROSCOPIC
Bilirubin Urine: NEGATIVE
Glucose, UA: NEGATIVE mg/dL
HGB URINE DIPSTICK: NEGATIVE
Ketones, ur: NEGATIVE mg/dL
Nitrite: NEGATIVE
PROTEIN: 30 mg/dL — AB
Specific Gravity, Urine: 1.035 — ABNORMAL HIGH (ref 1.005–1.030)
Urobilinogen, UA: 0.2 mg/dL (ref 0.0–1.0)
pH: 6 (ref 5.0–8.0)

## 2014-05-17 LAB — MONONUCLEOSIS SCREEN: MONO SCREEN: NEGATIVE

## 2014-05-17 LAB — BASIC METABOLIC PANEL
Anion gap: 12 (ref 5–15)
BUN: 5 mg/dL — ABNORMAL LOW (ref 6–23)
CO2: 21 mEq/L (ref 19–32)
CREATININE: 0.69 mg/dL (ref 0.50–1.10)
Calcium: 8.7 mg/dL (ref 8.4–10.5)
Chloride: 100 mEq/L (ref 96–112)
GFR calc Af Amer: >90 mL/min (ref >90)
GLUCOSE: 81 mg/dL (ref 70–99)
Potassium: 3.9 mEq/L (ref 3.7–5.3)
SODIUM: 133 meq/L — AB (ref 137–147)

## 2014-05-17 LAB — URINE MICROSCOPIC-ADD ON

## 2014-05-17 LAB — GC/CHLAMYDIA PROBE AMP
CT PROBE, AMP APTIMA: NEGATIVE
GC Probe RNA: NEGATIVE

## 2014-05-17 MED ORDER — ACETAMINOPHEN 500 MG PO TABS: 1000.0000 mg | ORAL_TABLET | Freq: Once | ORAL | Status: DC

## 2014-05-17 MED ORDER — SODIUM CHLORIDE 0.9 % IV BOLUS (SEPSIS)
1000.0000 mL | INTRAVENOUS | Status: AC
Start: 2014-05-17 — End: 2014-05-17
  Administered 2014-05-17: 1000 mL via INTRAVENOUS

## 2014-05-17 MED ORDER — ACETAMINOPHEN 325 MG PO TABS
650.0000 mg | ORAL_TABLET | Freq: Four times a day (QID) | ORAL | Status: DC | PRN
  Administered 2014-05-17: 650 mg via ORAL
  Filled 2014-05-17: qty 2

## 2014-05-17 NOTE — ED Provider Notes (Signed)
CSN: [id number]     Arrival date & time 05/17/14  1826 History   First MD Initiated Contact with Patient 05/17/14 1853     Chief Complaint  Patient presents with  . Fever  . Generalized Body Aches  . Abdominal Pain     (Consider location/radiation/quality/duration/timing/severity/associated sxs/prior Treatment) The history is provided by the patient and medical records. No language interpreter was used.    Marie Howell is a 28 y.o. female  with no major medical problems presents to the Emergency Department complaining of recurrent fever and myalgias onset > 2 weeks. Associated symptoms include vague, sometimes sharp lower abd pain (not current presently), generalized headache.  Nothing makes the symptoms better or worse.  Patient has not attempted any over-the-counter treatments, including a Tylenol or ibuprofen for fever. Pt denies recent travel, camping or known tick bites.  LMP: ended 2 days ago.  Record review shows that pt was evaluated for this on 05/15/14 without clear etiology for her fevers, but possibility of PID.  Pt reports severe sore throat several weeks ago which resolved; denies URI symptoms.    Past Medical History  Diagnosis Date  . UTI (lower urinary tract infection)    History reviewed. No pertinent past surgical history. No family history on file. History  Substance Use Topics  . Smoking status: Never Smoker   . Smokeless tobacco: Not on file  . Alcohol Use: No   OB History   Grav Para Term Preterm Abortions TAB SAB Ect Mult Living                 Review of Systems  Constitutional: Positive for fever. Negative for diaphoresis, appetite change, fatigue and unexpected weight change.  HENT: Positive for sore throat (resolved). Negative for congestion, drooling, ear discharge, ear pain, facial swelling, mouth sores, postnasal drip, rhinorrhea, sneezing and trouble swallowing.   Eyes: Negative for visual disturbance.  Respiratory: Negative for cough, chest  tightness, shortness of breath and wheezing.   Cardiovascular: Negative for chest pain.  Gastrointestinal: Positive for abdominal pain (lower). Negative for nausea, vomiting, diarrhea and constipation.  Endocrine: Negative for polydipsia, polyphagia and polyuria.  Genitourinary: Positive for pelvic pain. Negative for dysuria, urgency, frequency and hematuria.  Musculoskeletal: Negative for back pain and neck stiffness.  Skin: Negative for rash.  Allergic/Immunologic: Negative for immunocompromised state.  Neurological: Positive for headaches. Negative for syncope and light-headedness.  Hematological: Does not bruise/bleed easily.  Psychiatric/Behavioral: Negative for sleep disturbance. The patient is not nervous/anxious.       Allergies  Review of patient's allergies indicates no known allergies.  Home Medications   Prior to Admission medications   Medication Sig Start Date End Date Taking? Authorizing Provider  acetaminophen (TYLENOL) 500 MG tablet Take 500 mg by mouth every 6 (six) hours as needed for mild pain or moderate pain.   Yes Historical Provider, MD  ibuprofen (ADVIL,MOTRIN) 200 MG tablet Take 200 mg by mouth every 6 (six) hours as needed for fever or moderate pain.   Yes Historical Provider, MD  doxycycline (VIBRAMYCIN) 100 MG capsule Take 1 capsule (100 mg total) by mouth 2 (two) times daily. 05/16/14   Mercedes Strupp Camprubi-Soms, PA-C   BP 137/78  Pulse 96  Temp(Src) 98.4 F (36.9 C) (Rectal)  Resp 18  SpO2 100%  LMP 05/11/2014 Physical Exam  Nursing note and vitals reviewed. Constitutional: She is oriented to person, place, and time. She appears well-developed and well-nourished. No distress.  Awake, alert, nontoxic appearance  HENT:  Head: Normocephalic and atraumatic.  Right Ear: Tympanic membrane, external ear and ear canal normal.  Left Ear: Tympanic membrane, external ear and ear canal normal.  Nose: Nose normal. No epistaxis. Right sinus exhibits no  maxillary sinus tenderness and no frontal sinus tenderness. Left sinus exhibits no maxillary sinus tenderness and no frontal sinus tenderness.  Mouth/Throat: Uvula is midline, oropharynx is clear and moist and mucous membranes are normal. Mucous membranes are not pale and not cyanotic. No oropharyngeal exudate, posterior oropharyngeal edema, posterior oropharyngeal erythema or tonsillar abscesses.  Eyes: Conjunctivae and EOM are normal. Pupils are equal, round, and reactive to light. Right eye exhibits no discharge. Left eye exhibits no discharge. No scleral icterus.  Neck: Normal range of motion and full passive range of motion without pain. Neck supple. No spinous process tenderness and no muscular tenderness present. No rigidity. Normal range of motion present. No Brudzinski's sign and no Kernig's sign noted.  Cardiovascular: Normal rate, regular rhythm, normal heart sounds and intact distal pulses.   Pulmonary/Chest: Effort normal and breath sounds normal. No stridor. No respiratory distress. She has no wheezes.  Abdominal: Soft. Bowel sounds are normal. She exhibits no distension and no mass. There is no tenderness. There is no rebound and no guarding.  Musculoskeletal: Normal range of motion. She exhibits no edema.  Lymphadenopathy:       Head (right side): Submandibular and tonsillar adenopathy present. No submental, no preauricular, no posterior auricular and no occipital adenopathy present.       Head (left side): Submandibular and tonsillar adenopathy present. No submental, no preauricular, no posterior auricular and no occipital adenopathy present.    She has cervical adenopathy.       Right cervical: Superficial cervical adenopathy present. No deep cervical and no posterior cervical adenopathy present.      Left cervical: Superficial cervical adenopathy present. No deep cervical and no posterior cervical adenopathy present.    She has no axillary adenopathy.       Right: No supraclavicular  adenopathy present.       Left: No supraclavicular adenopathy present.  Neurological: She is alert and oriented to person, place, and time. She exhibits normal muscle tone. Coordination normal.  Speech is clear and goal oriented Moves extremities without ataxia  Skin: Skin is warm and dry. No rash noted. She is not diaphoretic. No erythema.  Psychiatric: She has a normal mood and affect.    ED Course  Procedures (including critical care time) Labs Review Labs Reviewed  URINALYSIS, ROUTINE W REFLEX MICROSCOPIC - Abnormal; Notable for the following:    Color, Urine AMBER (*)    APPearance CLOUDY (*)    Specific Gravity, Urine 1.035 (*)    Protein, ur 30 (*)    Leukocytes, UA TRACE (*)    All other components within normal limits  BASIC METABOLIC PANEL - Abnormal; Notable for the following:    Sodium 133 (*)    BUN 5 (*)    All other components within normal limits  URINE MICROSCOPIC-ADD ON - Abnormal; Notable for the following:    Squamous Epithelial / LPF FEW (*)    Bacteria, UA FEW (*)    All other components within normal limits  CBC WITH DIFFERENTIAL  MONONUCLEOSIS SCREEN  EHRLICHIA ANTIBODY PANEL  B. BURGDORFI ANTIBODIES BY WB  ROCKY MTN SPOTTED FVR AB, IGG-BLOOD  ROCKY MTN SPOTTED FVR AB, IGM-BLOOD    Imaging Review No results found.   EKG Interpretation None  MDM   Final diagnoses:  Fever, unspecified fever cause  Myalgia   Marie Howell presents with persistent fever and myalgias. On Record review pt was seen 2 days ago for the same complaint and diagnosed with PID via pelvic exam, ssx.  She was treated in the ED and D/C home with doxycycline but did not fill orr take this prescription. Pt febrile to 101.8 here in the ED and continues to c/o mild lower abd pain, though not present currently.  On exam, mild cervical lymphadenopathy, but no evidence of URI; abdomen soft and nontender.  Pt denies tick bites or rashes and none seen on undressed exam.  Will  recheck basic labs and test for RMSF, Lyme, Ehrlichiosis, Mono; give fluids and reassess.    11:19 PM UA is contaminated without evidence of urinary tract infection. CBC without leukocytosis and barely elevated compared to yesterday. Mono screen negative.  Ehrlichia., Lyme and [facility] fever titers pending.  Patient was soft and nontender abdomen. I did not repeat her pelvic exam because she was seen here and diagnosed with possible PID yesterday. She was treated for STD at that time. No change in her vague abdominal/pelvic discomfort in the last several days. At this time and do not see an indication for further imaging. She did not fill her doxycycline and I recommended that she do this. I also recommended that she see her OB/GYN within 3 days for further evaluation.  She reports her fever is unchanged and she had no nausea, vomiting or diarrhea.    Patient's fever remains of unknown origin; it is perhaps viral in origin and I discussed this with her.  I have personally reviewed patient's vitals, nursing note and any pertinent labs or imaging.  I performed an undressed physical exam.    At this time, it has been determined that no acute conditions requiring further emergency intervention. The patient/guardian have been advised of the diagnosis and plan. I reviewed all labs and imaging including any potential incidental findings. We have discussed signs and symptoms that warrant return to the ED, such as increasing fevers, nausea vomiting, development of abdominal pain or other concerning symptoms.  Patient/guardian has voiced understanding and agreed to follow-up with the PCP or specialist in 2 days.  Vital signs are stable at discharge.   BP 137/78  Pulse 96  Temp(Src) 98.4 F (36.9 C) (Rectal)  Resp 18  SpO2 100%  LMP 05/11/2014        Dierdre ForthHannah Asianae Minkler, PA-C 05/17/14 2327

## 2014-05-17 NOTE — Progress Notes (Signed)
  CARE MANAGEMENT ED NOTE 05/17/2014  Patient:  Marie Howell,Marie Howell   Account Number:  [id number]  Date Initiated:  05/17/2014  Documentation initiated by:  Radford PaxFERRERO,Joelle Flessner  Subjective/Objective Assessment:   Patient presents to Ed with knot to the back of her foot.     Subjective/Objective Assessment Detail:     Action/Plan:   Action/Plan Detail:   Anticipated DC Date:       Status Recommendation to Physician:   Result of Recommendation:    Other ED Services  Consult Working Plan    DC Planning Services  Other  PCP issues    Choice offered to / List presented to:            Status of service:  Completed, signed off  ED Comments:   ED Comments Detail:  EDCM spoke to patient at bedside.  Patient confirms she [place] a [place]cp [place]. [facility].  [facility] informed patient that walk ins are welcome from 9am -1030am Mon-Thurs.  EDCM also provided patient with list of pcps who accept self pay patients, list of discounted pharmacies and websites needymeds.org and Good https://figueroa.info/X.com for medication assistance, phone number to inquire about Affordable Care act and DSS for Medicaid for insurance, financial resoources in th community sucha s local churches and [employer], and dental assistance for uninsured patients.  [facility].  Patient thankful for resources.  No further EDCM needs at this time.

## 2014-05-17 NOTE — ED Notes (Addendum)
Pt states that she has been having fever, chills, body aches for 2 weeks.  Pt states that she took an advil 530min-1 hour before she came here. Pt states about a month ago she had box like object fall and scrap the back of her foot and now has a knot there. Pt doesn't know when the last time she had tetanus shot.  Pt states, "I think i also have shortness of breath".

## 2014-05-17 NOTE — Discharge Instructions (Signed)
1. Medications: doxycycline as prescribed yesterday, usual home medications 2. Treatment: rest, drink plenty of fluids, alternate tylenol and motrin every 3 hours for fever control 3. Follow Up: Please followup with your primary doctor and your OB/GYN (or the one listed) within 3 day for discussion of your diagnoses and further evaluation after today's visit; if you do not have a primary care doctor use the resource guide provided to find one;    Fever, Adult A fever is a higher than normal body temperature. In an adult, an oral temperature around 98.6 F (37 C) is considered normal. A temperature of 100.4 F (38 C) or higher is generally considered a fever. Mild or moderate fevers generally have no long-term effects and often do not require treatment. Extreme fever (greater than or equal to 106 F or 41.1 C) can cause seizures. The sweating that may occur with repeated or prolonged fever may cause dehydration. Elderly people can develop confusion during a fever. A measured temperature can vary with:  Age.  Time of day.  Method of measurement (mouth, underarm, rectal, or ear). The fever is confirmed by taking a temperature with a thermometer. Temperatures can be taken different ways. Some methods are accurate and some are not.  An oral temperature is used most commonly. Electronic thermometers are fast and accurate.  An ear temperature will only be accurate if the thermometer is positioned as recommended by the manufacturer.  A rectal temperature is accurate and done for those adults who have a condition where an oral temperature cannot be taken.  An underarm (axillary) temperature is not accurate and not recommended. Fever is a symptom, not a disease.  CAUSES   Infections commonly cause fever.  Some noninfectious causes for fever include:  Some arthritis conditions.  Some thyroid or adrenal gland conditions.  Some immune system conditions.  Some types of cancer.  A medicine  reaction.  High doses of certain street drugs such as methamphetamine.  Dehydration.  Exposure to high outside or room temperatures.  Occasionally, the source of a fever cannot be determined. This is sometimes called a "fever of unknown origin" (FUO).  Some situations may lead to a temporary rise in body temperature that may go away on its own. Examples are:  Childbirth.  Surgery.  Intense exercise. HOME CARE INSTRUCTIONS   Take appropriate medicines for fever. Follow dosing instructions carefully. If you use acetaminophen to reduce the fever, be careful to avoid taking other medicines that also contain acetaminophen. Do not take aspirin for a fever if you are younger than age 28. There is an association with Reye's syndrome. Reye's syndrome is a rare but potentially deadly disease.  If an infection is present and antibiotics have been prescribed, take them as directed. Finish them even if you start to feel better.  Rest as needed.  Maintain an adequate fluid intake. To prevent dehydration during an illness with prolonged or recurrent fever, you may need to drink extra fluid.Drink enough fluids to keep your urine clear or pale yellow.  Sponging or bathing with room temperature water may help reduce body temperature. Do not use ice water or alcohol sponge baths.  Dress comfortably, but do not over-bundle. SEEK MEDICAL CARE IF:   You are unable to keep fluids down.  You develop vomiting or diarrhea.  You are not feeling at least partly better after 3 days.  You develop new symptoms or problems. SEEK IMMEDIATE MEDICAL CARE IF:   You have shortness of breath or trouble breathing.  You develop excessive weakness.  You are dizzy or you faint.  You are extremely thirsty or you are making little or no urine.  You develop new pain that was not there before (such as in the head, neck, chest, back, or abdomen).  You have persistant vomiting and diarrhea for more than 1 to 2  days.  You develop a stiff neck or your eyes become sensitive to light.  You develop a skin rash.  You have a fever or persistent symptoms for more than 2 to 3 days.  You have a fever and your symptoms suddenly get worse. MAKE SURE YOU:   Understand these instructions.  Will watch your condition.  Will get help right away if you are not doing well or get worse. Document Released: 04/23/2001 Document Revised: 01/20/2012 Document Reviewed: 08/29/2011 [facility] Patient Information 2015 [place]. This information is not intended to replace advice given to you by your health care provider. Make sure you discuss any questions you have with your health care provider.

## 2014-05-18 LAB — ROCKY MTN SPOTTED FVR AB, IGM-BLOOD: RMSF IGM: 0.53 IV (ref 0.00–0.89)

## 2014-05-18 LAB — ROCKY MTN SPOTTED FVR AB, IGG-BLOOD: RMSF IGG: 0.15 IV

## 2014-05-18 LAB — B. BURGDORFI ANTIBODIES BY WB
B BURGDORFERI IGM ABS (IB): POSITIVE
B burgdorferi IgG Abs (IB): NEGATIVE

## 2014-05-18 NOTE — ED Provider Notes (Signed)
Medical screening examination/treatment/procedure(s) were performed by non-physician practitioner and as supervising physician I was immediately available for consultation/collaboration.    Tyrese Ficek, MD 05/18/14 0010 

## 2014-05-20 ENCOUNTER — Encounter (HOSPITAL_COMMUNITY): Payer: Self-pay | Admitting: Emergency Medicine

## 2014-05-20 ENCOUNTER — Emergency Department (HOSPITAL_COMMUNITY)
Admission: EM | Admit: 2014-05-20 | Discharge: 2014-05-20 | Discharge status: Against Medical Advice | Payer: Self-pay | Attending: Emergency Medicine | Admitting: Emergency Medicine

## 2014-05-20 ENCOUNTER — Emergency Department (INDEPENDENT_AMBULATORY_CARE_PROVIDER_SITE_OTHER)
Admission: EM | Admit: 2014-05-20 | Discharge: 2014-05-20 | Disposition: A | Discharge status: Nursing Home (Includes ICF) | Payer: Self-pay | Source: Home / Self Care | Attending: Family Medicine | Admitting: Family Medicine

## 2014-05-20 DIAGNOSIS — Z5321 Procedure and treatment not carried out due to patient leaving prior to being seen by health care provider: Secondary | ICD-10-CM

## 2014-05-20 DIAGNOSIS — A6920 Lyme disease, unspecified: Secondary | ICD-10-CM | POA: Insufficient documentation

## 2014-05-20 NOTE — ED Provider Notes (Signed)
Marie Howell is a 28 y.o. female who left without being seen after triage. I did not evaluate this patient.  Marie Emeryicole Estie Sproule, PA-C 05/20/14 [phone]

## 2014-05-20 NOTE — ED Notes (Signed)
Patient treated 3 weeks ago for PID. Still has fever and [facility] and suspect L[facility] Disease

## 2014-05-20 NOTE — ED Provider Notes (Signed)
CSN: [id number]     Arrival date & time 05/20/14  1427 History   First MD Initiated Contact with Patient 05/20/14 1601     Chief Complaint  Patient presents with  . Weakness   (Consider location/radiation/quality/duration/timing/severity/associated sxs/prior Treatment) Patient is a 28 y.o. female presenting with weakness. The history is provided by the patient.  Weakness This is a recurrent problem. The current episode started more than 1 week ago. The problem has been gradually worsening. Associated symptoms comments: Weakness, tired, leg pains, no rash..    Past Medical History  Diagnosis Date  . UTI (lower urinary tract infection)    History reviewed. No pertinent past surgical history. History reviewed. No pertinent family history. History  Substance Use Topics  . Smoking status: Never Smoker   . Smokeless tobacco: Not on file  . Alcohol Use: No   OB History   Grav Para Term Preterm Abortions TAB SAB Ect Mult Living                 Review of Systems  Respiratory: Negative for cough.   Gastrointestinal: Negative.   Genitourinary: Positive for pelvic pain.  Musculoskeletal: Positive for myalgias.  Skin: Negative.  Negative for rash.  Neurological: Positive for weakness.  Psychiatric/Behavioral: The patient is nervous/anxious.     Allergies  Review of patient's allergies indicates no known allergies.  Home Medications   Prior to Admission medications   Medication Sig Start Date End Date Taking? Authorizing Provider  acetaminophen (TYLENOL) 500 MG tablet Take 500 mg by mouth every 6 (six) hours as needed for mild pain or moderate pain.    Historical Provider, MD  doxycycline (VIBRAMYCIN) 100 MG capsule Take 1 capsule (100 mg total) by mouth 2 (two) times daily. 05/16/14   Mercedes Strupp Camprubi-Soms, PA-C  ibuprofen (ADVIL,MOTRIN) 200 MG tablet Take 200 mg by mouth every 6 (six) hours as needed for fever or moderate pain.    Historical Provider, MD   BP 129/90   Pulse 97  Temp(Src) 99.1 F (37.3 C) (Oral)  Resp 16  SpO2 99%  LMP 05/11/2014 Physical Exam  Nursing note and vitals reviewed. Constitutional: She is oriented to person, place, and time. She appears well-developed and well-nourished.  Neck: Normal range of motion. Neck supple.  Cardiovascular: Normal heart sounds.   Pulmonary/Chest: Effort normal and breath sounds normal.  Lymphadenopathy:    She has no cervical adenopathy.  Neurological: She is alert and oriented to person, place, and time.  Skin: Skin is warm and dry.    ED Course  Procedures (including critical care time) Labs Review Labs Reviewed - No data to display  Imaging Review No results found.   MDM   1. Lyme disease, acute        Linna HoffJames D Bernarr Longsworth, MD 05/20/14 [phone]

## 2014-05-20 NOTE — ED Notes (Signed)
Pt   [facility]  [facility]  Days  Ago  [facility]  What   She  described  As  A  Virus    -  she  Is  Taking  Doxycycline        fror   pid   That  She  Was  rx      5  Days  Ago    On a  Prior    Visit    This  Makes  The  3 rd  Visit     In  5  Days     -  She  Also  Reports  Some  Diarrhea        As  Well

## 2014-05-21 LAB — EHRLICHIA ANTIBODY PANEL: E chaffeensis (HGE) Ab, IgM: <1:20 {titer}

## 2014-05-21 NOTE — ED Provider Notes (Signed)
Medical screening examination/treatment/procedure(s) were performed by non-physician practitioner and as supervising physician I was immediately available for consultation/collaboration.   EKG Interpretation None        Jeremi Losito, MD 05/21/14 0001 

## 2014-05-27 ENCOUNTER — Telehealth (HOSPITAL_BASED_OUTPATIENT_CLINIC_OR_DEPARTMENT_OTHER): Payer: Self-pay

## 2014-05-27 NOTE — Telephone Encounter (Signed)
Pt returned call.  Pt informed (+) Lyme and to complete Doxycycline Rx as prescribed and f/u w/PCP.  Pt stated she has missed some doses.  Pt instructed to complete as prescribed and f/u w/PCP.

## 2014-05-27 NOTE — Telephone Encounter (Signed)
Chart reviewed by P. Dammen PA "Call PCP to follow up with positive test (Lyme disease).  Continue Doxycycline for full time (very important)."  Pt returned and was seen @ [facility] and informed (+) for Lyme disease.  6/17 @ 18:47 LVM requesting callback.

## 2014-06-06 ENCOUNTER — Emergency Department (HOSPITAL_COMMUNITY)
Admission: EM | Admit: 2014-06-06 | Discharge: 2014-06-06 | Disposition: A | Discharge status: Home / Self Care | Payer: Self-pay | Attending: Emergency Medicine | Admitting: Emergency Medicine

## 2014-06-06 ENCOUNTER — Emergency Department (HOSPITAL_COMMUNITY): Payer: Self-pay

## 2014-06-06 ENCOUNTER — Encounter (HOSPITAL_COMMUNITY): Payer: Self-pay | Admitting: Emergency Medicine

## 2014-06-06 DIAGNOSIS — R059 Cough, unspecified: Secondary | ICD-10-CM | POA: Insufficient documentation

## 2014-06-06 DIAGNOSIS — N949 Unspecified condition associated with female genital organs and menstrual cycle: Secondary | ICD-10-CM | POA: Insufficient documentation

## 2014-06-06 DIAGNOSIS — M791 Myalgia, unspecified site: Secondary | ICD-10-CM

## 2014-06-06 DIAGNOSIS — J3489 Other specified disorders of nose and nasal sinuses: Secondary | ICD-10-CM | POA: Insufficient documentation

## 2014-06-06 DIAGNOSIS — Z87440 Personal history of urinary (tract) infections: Secondary | ICD-10-CM | POA: Insufficient documentation

## 2014-06-06 DIAGNOSIS — Z79899 Other long term (current) drug therapy: Secondary | ICD-10-CM | POA: Insufficient documentation

## 2014-06-06 DIAGNOSIS — R509 Fever, unspecified: Secondary | ICD-10-CM | POA: Insufficient documentation

## 2014-06-06 DIAGNOSIS — Z3202 Encounter for pregnancy test, result negative: Secondary | ICD-10-CM | POA: Insufficient documentation

## 2014-06-06 DIAGNOSIS — IMO0001 Reserved for inherently not codable concepts without codable children: Secondary | ICD-10-CM | POA: Insufficient documentation

## 2014-06-06 DIAGNOSIS — R0602 Shortness of breath: Secondary | ICD-10-CM | POA: Insufficient documentation

## 2014-06-06 DIAGNOSIS — R05 Cough: Secondary | ICD-10-CM | POA: Insufficient documentation

## 2014-06-06 DIAGNOSIS — A6920 Lyme disease, unspecified: Secondary | ICD-10-CM | POA: Insufficient documentation

## 2014-06-06 DIAGNOSIS — M542 Cervicalgia: Secondary | ICD-10-CM | POA: Insufficient documentation

## 2014-06-06 DIAGNOSIS — Z8619 Personal history of other infectious and parasitic diseases: Secondary | ICD-10-CM | POA: Insufficient documentation

## 2014-06-06 DIAGNOSIS — M549 Dorsalgia, unspecified: Secondary | ICD-10-CM | POA: Insufficient documentation

## 2014-06-06 HISTORY — DX: Lyme disease, unspecified: A69.20

## 2014-06-06 HISTORY — DX: Acute parametritis and pelvic cellulitis: N73.0

## 2014-06-06 LAB — COMPREHENSIVE METABOLIC PANEL
ALT: 44 U/L — AB (ref 0–35)
AST: 61 U/L — ABNORMAL HIGH (ref 0–37)
Albumin: 3 g/dL — ABNORMAL LOW (ref 3.5–5.2)
Alkaline Phosphatase: 141 U/L — ABNORMAL HIGH (ref 39–117)
Anion gap: 14 (ref 5–15)
BUN: 8 mg/dL (ref 6–23)
CO2: 22 mEq/L (ref 19–32)
Calcium: 9.3 mg/dL (ref 8.4–10.5)
Chloride: 98 mEq/L (ref 96–112)
Creatinine, Ser: 0.61 mg/dL (ref 0.50–1.10)
GFR calc Af Amer: >90 mL/min (ref >90)
GLUCOSE: 113 mg/dL — AB (ref 70–99)
Potassium: 4.1 mEq/L (ref 3.7–5.3)
SODIUM: 134 meq/L — AB (ref 137–147)
Total Bilirubin: <0.2 mg/dL — ABNORMAL LOW (ref 0.3–1.2)
Total Protein: 7.8 g/dL (ref 6.0–8.3)

## 2014-06-06 LAB — CBC WITH DIFFERENTIAL/PLATELET
Basophils Absolute: 0 10*3/uL (ref 0.0–0.1)
Basophils Relative: 0 % (ref 0–1)
EOS PCT: 0 % (ref 0–5)
Eosinophils Absolute: 0 10*3/uL (ref 0.0–0.7)
HEMATOCRIT: 36.3 % (ref 36.0–46.0)
HEMOGLOBIN: 11.8 g/dL — AB (ref 12.0–15.0)
LYMPHS ABS: 0.6 10*3/uL — AB (ref 0.7–4.0)
LYMPHS PCT: 9 % — AB (ref 12–46)
MCH: 26.4 pg (ref 26.0–34.0)
MCHC: 32.5 g/dL (ref 30.0–36.0)
MCV: 81.2 fL (ref 78.0–100.0)
Monocytes Absolute: 0.4 10*3/uL (ref 0.1–1.0)
Monocytes Relative: 7 % (ref 3–12)
Neutro Abs: 5.3 10*3/uL (ref 1.7–7.7)
Neutrophils Relative %: 84 % — ABNORMAL HIGH (ref 43–77)
PLATELETS: 243 10*3/uL (ref 150–400)
RBC: 4.47 MIL/uL (ref 3.87–5.11)
RDW: 14.6 % (ref 11.5–15.5)
WBC: 6.3 10*3/uL (ref 4.0–10.5)

## 2014-06-06 LAB — URINALYSIS, ROUTINE W REFLEX MICROSCOPIC
BILIRUBIN URINE: NEGATIVE
GLUCOSE, UA: NEGATIVE mg/dL
Hgb urine dipstick: NEGATIVE
KETONES UR: NEGATIVE mg/dL
Leukocytes, UA: NEGATIVE
Nitrite: NEGATIVE
PROTEIN: NEGATIVE mg/dL
Specific Gravity, Urine: 1.029 (ref 1.005–1.030)
Urobilinogen, UA: 1 mg/dL (ref 0.0–1.0)
pH: 7.5 (ref 5.0–8.0)

## 2014-06-06 LAB — PREGNANCY, URINE: PREG TEST UR: NEGATIVE

## 2014-06-06 LAB — I-STAT CG4 LACTIC ACID, ED: Lactic Acid, Venous: 1.14 mmol/L (ref 0.5–2.2)

## 2014-06-06 LAB — RAPID HIV SCREEN (WH-MAU): SUDS RAPID HIV SCREEN: NONREACTIVE

## 2014-06-06 MED ORDER — SODIUM CHLORIDE 0.9 % IV BOLUS (SEPSIS)
30.0000 mL/kg | Freq: Once | INTRAVENOUS | Status: AC
  Administered 2014-06-06: 2178 mL via INTRAVENOUS

## 2014-06-06 MED ORDER — SODIUM CHLORIDE 0.9 % IV SOLN
1000.0000 mL | INTRAVENOUS | Status: DC
  Administered 2014-06-06: 1000 mL via INTRAVENOUS

## 2014-06-06 MED ORDER — ACETAMINOPHEN 325 MG PO TABS
650.0000 mg | ORAL_TABLET | Freq: Once | ORAL | Status: AC
  Administered 2014-06-06: 650 mg via ORAL
  Filled 2014-06-06: qty 2

## 2014-06-06 NOTE — ED Provider Notes (Signed)
CSN: [id number]     Arrival date & time 06/06/14  0350 History   First MD Initiated Contact with Patient 06/06/14 0606     Chief Complaint  Patient presents with  . Lyme Disease  . Back Pain  . Neck Pain   HPI Comments: Patient is a 28 y.o. Female who presents to the ED with back pain, neck pain, and fever.  Patient states that over the past month she feels that she has had subjective fever which she has not checked at home.  She states that her fever is associated with hot and cold chills, myalgias, intermittent headaches, achey back and neck pain, leg stiffness, congestion, runny nose, bilateral ear pain, blurry vision, increased loss of balance, weakness "everywhere", urinary frequency, urinary urgency, nausea, and intermittent lower abdominal pain.  Patient denies vomiting, dysuria, diarrhea, constipation, melena, and hematochezia.  Patient has been trying hot showers, alternating tylenol and motrin for fevers.  Patient was seen here on 05/15/14 for fever, abdominal pain and vaginal discharge.  At that time the patient was diagnosed with suspected PID and was treated with azithromycin, ceftriaxone, and was given a home prescription of doxycyline.  GC came back negative at that time.  Patient states that she did not fill her prescription of doxycycline right away and did not take it properly.  Patient returned on 05/17/14 and had continued abdominal pain and myalgias.  Patient was tested at this time for lyme disease.  Patient returned to the ED on 05/20/14 who continued to have weakness and myalgias.  At this time she was found to have acute lyme disease with positive IgM for lyme disease.  Patient was told to take her doxycyline and finish the course.  She states at this time that she has finished her course of doxycyline.       Patient is a 28 y.o. female presenting with back pain and neck pain. The history is provided by the patient. No language interpreter was used.  Back Pain Associated symptoms:  fever, headaches, pelvic pain and weakness   Associated symptoms: no dysuria and no numbness   Neck Pain Associated symptoms: fever, headaches and weakness   Associated symptoms: no numbness     Past Medical History  Diagnosis Date  . UTI (lower urinary tract infection)   . Lyme disease   . PID (acute pelvic inflammatory disease)    History reviewed. No pertinent past surgical history. No family history on file. History  Substance Use Topics  . Smoking status: Never Smoker   . Smokeless tobacco: Never Used  . Alcohol Use: No   OB History   Grav Para Term Preterm Abortions TAB SAB Ect Mult Living                 Review of Systems  Constitutional: Positive for fever, chills and fatigue.  HENT: Positive for congestion, postnasal drip and rhinorrhea. Negative for sore throat, trouble swallowing and voice change.   Respiratory: Positive for cough and shortness of breath. Negative for chest tightness and wheezing.   Genitourinary: Positive for urgency, frequency and pelvic pain. Negative for dysuria, hematuria, vaginal bleeding, vaginal discharge and vaginal pain.  Musculoskeletal: Positive for back pain, myalgias and neck pain. Negative for arthralgias, gait problem, joint swelling and neck stiffness.  Neurological: Positive for weakness, light-headedness and headaches. Negative for dizziness, seizures, syncope, facial asymmetry, speech difficulty and numbness.  All other systems reviewed and are negative.     Allergies  Review of patient's allergies  indicates no known allergies.  Home Medications   Prior to Admission medications   Medication Sig Start Date End Date Taking? Authorizing Provider  acetaminophen (TYLENOL) 500 MG tablet Take 500 mg by mouth every 6 (six) hours as needed for mild pain or moderate pain.   Yes Historical Provider, MD  ibuprofen (ADVIL,MOTRIN) 200 MG tablet Take 200 mg by mouth every 6 (six) hours as needed for fever or moderate pain.   Yes  Historical Provider, MD   BP 107/69  Pulse 72  Temp(Src) 98.3 F (36.8 C) (Oral)  Resp 16  Ht 5\' 2"  (1.575 m)  Wt 160 lb (72.576 kg)  BMI 29.26 kg/m2  SpO2 100%  LMP 05/11/2014 Physical Exam  Nursing note and vitals reviewed. Constitutional: She is oriented to person, place, and time. She appears well-developed and well-nourished. No distress.  HENT:  Head: Normocephalic and atraumatic.  Mouth/Throat: Oropharynx is clear and moist. No oropharyngeal exudate.  Eyes: Conjunctivae and EOM are normal. Pupils are equal, round, and reactive to light. No scleral icterus.  Neck: Normal range of motion. Neck supple. No JVD present. No spinous process tenderness and no muscular tenderness present. No rigidity. No edema, no erythema and normal range of motion present. No Brudzinski's sign and no Kernig's sign noted. No thyromegaly present.  Cardiovascular: Regular rhythm, normal heart sounds and intact distal pulses.  Tachycardia present.  Exam reveals no gallop and no friction rub.   No murmur heard. Pulses:      Dorsalis pedis pulses are 2+ on the right side, and 2+ on the left side.       Posterior tibial pulses are 2+ on the right side, and 2+ on the left side.  Pulmonary/Chest: Effort normal and breath sounds normal. No respiratory distress. She has no wheezes. She has no rales. She exhibits no tenderness.  Abdominal: Soft. Normal appearance and bowel sounds are normal. She exhibits no distension and no mass. There is tenderness in the suprapubic area. There is no rigidity, no rebound, no guarding, no tenderness at McBurney's point and negative Murphy's sign.  Musculoskeletal: Normal range of motion.       Cervical back: She exhibits normal range of motion, no tenderness, no bony tenderness, no swelling, no edema, no deformity, no laceration, no pain, no spasm and normal pulse.       Thoracic back: She exhibits normal range of motion, no tenderness, no bony tenderness, no swelling, no edema, no  deformity, no laceration, no pain, no spasm and normal pulse.       Lumbar back: She exhibits normal range of motion, no tenderness, no bony tenderness, no swelling, no edema, no deformity, no laceration, no pain, no spasm and normal pulse.  Lymphadenopathy:    She has cervical adenopathy.  Neurological: She is alert and oriented to person, place, and time. She has normal strength. No cranial nerve deficit or sensory deficit. Coordination normal.  Skin: Skin is warm and dry. She is not diaphoretic.  Psychiatric: She has a normal mood and affect. Her behavior is normal. Judgment and thought content normal.    ED Course  Procedures (including critical care time) Labs Review Labs Reviewed  CBC WITH DIFFERENTIAL - Abnormal; Notable for the following:    Hemoglobin 11.8 (*)    Neutrophils Relative % 84 (*)    Lymphocytes Relative 9 (*)    Lymphs Abs 0.6 (*)    All other components within normal limits  COMPREHENSIVE METABOLIC PANEL - Abnormal; Notable for the  following:    Sodium 134 (*)    Glucose, Bld 113 (*)    Albumin 3.0 (*)    AST 61 (*)    ALT 44 (*)    Alkaline Phosphatase 141 (*)    Total Bilirubin <0.2 (*)    All other components within normal limits  URINALYSIS, ROUTINE W REFLEX MICROSCOPIC - Abnormal; Notable for the following:    APPearance CLOUDY (*)    All other components within normal limits  CULTURE, BLOOD (ROUTINE X 2)  CULTURE, BLOOD (ROUTINE X 2)  URINE CULTURE  PREGNANCY, URINE  RAPID HIV SCREEN ([facility])  I-STAT CG4 LACTIC ACID, ED    Imaging Review Dg Chest Port 1 View  06/06/2014   CLINICAL DATA:  Pain all over.  History of Lyme disease.  EXAM: PORTABLE CHEST - 1 VIEW  COMPARISON:  None.  FINDINGS: Midline trachea. Normal heart size and mediastinal contours. No pleural effusion or pneumothorax. Clear lungs.  IMPRESSION: Normal chest.   Electronically Signed   By: Jeronimo GreavesKyle  Talbot M.D.   On: 06/06/2014 07:23     EKG Interpretation None      MDM   Final  diagnoses:  Fever of unknown origin  Myalgia   Patient is a 28 y.o. Female who presents to the ED with fever, neck, and back pain.  Patient was recently diagnosed and treated for possible PID and acute lyme disease.  Patient has taken azithromycin, ceftriaxone, and doxycycline with no relief of her fevers.  Physical exam at this time reveals no signs of meningismus or focal neurological deficits.  Patient was febrile with a temperature of 102.5 F and was tachycardic upon arrival.  Patient was treated here with 2 L fluids and also tylenol with normalization of vitals and decrease of pain.  Blood cultures and HIV are currently pending at this time.  CBC shows mild anemia with no frank leukocytosis.  CMP shows mild hyponatremia which likely normalized after fluids here and slight elevation of her LFTs.  UA shows no evidence for infection at this time.  Lactic acid is normal at this time.  CXR revealed no acute cardiopulmonary disease.    At this time patient has a fever with an unknown source x 22 days.  Patient has been treated with multiple antibiotics.  Given normalization of vitals with fluids and tylenol  Patient can follow-up with infectious disease in the outpatient setting at this time.  Patient is stable for discharge at this time.  She was instructed to drink lots of fluids and to alternate tylenol and motrin as needed.  Patient is to make an appointment to see ID upon discharge.  She was told to return to the ED for meningeal signs, fever which is unresponsive to medications, or any other concerning symptoms.  She states understanding at this time.  The patient was seen by Dr. Ranae PalmsYelverton who agrees with the above workup.  Lab results and history were discussed with Dr. Patria Maneampos who agrees with the treatment and disposition of the patient.  Patient is stable for discharge at this time.    Vitals prior to discharge 98 F (36.7 C) Oral, Pulse 90 BP 111/74 [employer]    Eben Burowourtney A Forcucci,  PA-C 06/06/14 [phone]

## 2014-06-06 NOTE — ED Notes (Signed)
Pt states that she was recently dx with Lyme's Disease and just finished her medication on Friday but she is still having significant joint pain primarily in her lower back and neck. Pt also c/o generalized weakness and malaise.

## 2014-06-06 NOTE — Discharge Instructions (Signed)
Fever, Adult °A fever is a temperature of 100.4° F (38° C) or above.  °HOME CARE °· Take fever medicine as told by your doctor. Do not  take aspirin for fever if you are younger than 28 years of age. °· If you are given antibiotic medicine, take it as told. Finish the medicine even if you start to feel better. °· Rest. °· Drink enough fluids to keep your pee (urine) clear or pale yellow. Do not drink alcohol. °· Take a bath or shower with room temperature water. Do not use ice water or alcohol sponge baths. °· Wear lightweight, loose clothes. °GET HELP RIGHT AWAY IF:  °· You are short of breath or have trouble breathing. °· You are very weak. °· You are dizzy or you pass out (faint). °· You are very thirsty or are making little or no urine. °· You have new pain. °· You throw up (vomit) or have watery poop (diarrhea). °· You keep throwing up or having watery poop for more than 1 to 2 days. °· You have a stiff neck or light bothers your eyes. °· You have a skin rash. °· You have a fever or problems (symptoms) that last for more than 2 to 3 days. °· You have a fever and your problems quickly get worse. °· You keep throwing up the fluids you drink. °· You do not feel better after 3 days. °· You have new problems. °MAKE SURE YOU:  °· Understand these instructions. °· Will watch your condition. °· Will get help right away if you are not doing well or get worse. °Document Released: 08/06/2008 Document Revised: 01/20/2012 Document Reviewed: 08/29/2011 °ExitCare® Patient Information ©2015 [employer]. This information is not intended to replace advice given to you by your health care provider. Make sure you discuss any questions you have with your health care provider. ° °Emergency Department Resource Guide °1) Find a Doctor and Pay Out of Pocket °Although you won't have to find out who is covered by your insurance plan, it is a good idea to ask around and get recommendations. You will then need to call the office and see  if the doctor you have chosen will accept you as a new patient and what types of options they offer for patients who are self-pay. Some doctors offer discounts or will set up payment plans for their patients who do not have insurance, but you will need to ask so you aren't surprised when you get to your appointment. ° °2) Contact Your Local Health Department °Not all health departments have doctors that can see patients for sick visits, but many do, so it is worth a call to see if yours does. If you don't know where your local health department is, you can check in your phone book. The CDC also has a tool to help you locate your state's health department, and many state websites also have listings of all of their local health departments. ° °3) Find a Walk-in Clinic °If your illness is not likely to be very severe or complicated, you may want to try a walk in clinic. These are popping up all over the country in pharmacies, drugstores, and shopping centers. They're usually staffed by nurse practitioners or physician assistants that have been trained to treat common illnesses and complaints. They're usually fairly quick and inexpensive. However, if you have serious medical issues or chronic medical problems, these are probably not your best option. ° °No Primary Care Doctor: °- Call [facility]   at  832-8000 - they can help you locate a primary care doctor that  accepts your insurance, provides certain services, etc. °- Physician Referral Service- 1-800-533-3463 ° °Chronic Pain Problems: °Organization         Address  Phone   Notes  °[facility]  (336) 297-2271 Patients need to be referred by their primary care doctor.  ° °Medication Assistance: °Organization         Address  Phone   Notes  °[place] [facility] [address] °[place], [place] [address] (336) 641-8030 --Must be a resident of [place] °-- Must have NO insurance coverage whatsoever (no  Medicaid/ Medicare, etc.) °-- The pt. MUST have a primary care doctor that directs their care regularly and follows them in the community °  °[facility]  (866) 331-1348   °[employer]  (888) 892-1162   ° °Agencies that provide inexpensive medical care: °Organization         Address  Phone   Notes  °[facility]  (336) 832-8035   °[facility]    (336) 832-7272   °[facility] [address] °[place], [place] [address] (336) 832-4777   °[facility] [address], °[place] (336) 271-4999   °[facility]    (336) 373-0678   °[facility]    (336) 272-1050   °[facility] ° [address], [place] Phone:  (336) 832-4444, Fax:  (336) 832-4440 Hours of Operation:  9 am - 6 pm, M-F.  Also accepts Medicaid/Medicare and self-pay.  °[facility] ° [address], [place] Phone: (336) 832-3150, Fax: (336) 832-3151. Hours of Operation:  8:30 am - 5:30 pm, M-F.  Also accepts Medicaid and self-pay.  °[facility] [place] [address], [place] Phone: (336) 878-6027   °[facility] [address], [place], [place] (336)723-1848, Ext. 123 Mondays & Thursdays: 7-9 AM.  First 15 patients are seen on a first come, first serve basis. °  ° °Medicaid-accepting [place] Providers: ° °Organization         Address  Phone   Notes  °[facility] [address], [place] (336) 641-2100 Also accepts self-pay patients.  °[facility] [address], [place] ° (336) 856-9996   °[facility] [address], [place] (336) 288-8857   °[facility] [address] [place] [address], [place] (336) 299-7000   °Veita Bland [address], [place]  ° (336) 373-1557 Only accepts [place] Access Medicaid patients after they have their name applied to their card.   ° °Self-Pay (no insurance) in [place]: ° °Organization         Address  Phone   Notes  °Sickle Cell Patients, [facility] [address], [place] (336) 832-1970   °[facility] [address], [place] (336) 832-4400   °[facility] ° [address], [place] (336) 992-4800   °[facility]/Dr. Osei-Bonsu ° [address] [place] [address], [place] or [address], [place] (336) 841-8500 Phone number for both [place] and [place] locations is the same.  °[facility] [address], [place] (336) 299-0000   °[facility] [address] [place]   [address], [place] or [address] (336) 852-7530 °(336) 878-2260   °[facility] [address], [place] (336) 350-1642, phone; (336) 294-5005, fax Sees patients 1st and 3rd Saturday of every month.  Must not qualify for public or private insurance (i.e. Medicaid, Medicare, [place] Health Choice, Veterans' Benefits) • Household income should be no more than 200% of the poverty level •The clinic cannot treat you if you are pregnant or think you are pregnant • Sexually transmitted diseases are not treated at the clinic.  ° ° °Dental Care: °Organization         Address  Phone  Notes  °[facility] [address], [place] (336) 641-6152 Accepts children up to age 21 who are enrolled in Medicaid or [place] Health Choice; pregnant women with a Medicaid card; and children who have applied for Medicaid or [place] Health Choice, but were declined, whose parents can pay a reduced fee at time of service.  °[facility] [place]  [address], [place] (336) 641-7733 Accepts children up to age 21 who are enrolled in Medicaid or [place] Health Choice; pregnant women with a Medicaid card; and children who have applied for Medicaid or [place] Health Choice,  but were declined, whose parents can pay a reduced fee at time of service.  °[facility] ° [address], [place] (336) 641-4533 Patients are seen by appointment only. Walk-ins are not accepted. [facility] will see patients 18 years of age and older. °Monday - Tuesday (8am-5pm) °Most Wednesdays (8:30-5pm) °$30 per visit, cash only  °[facility] ° [address], [place] (336) 641-4533 Patients are seen by appointment only. Walk-ins are not accepted. [facility] will see patients 18 years of age and older. °One Wednesday Evening (Monthly: Volunteer Based).  $30 per visit, cash only  °[facility]  (919) 537-3737 for adults; Children under age 4, call Graduate Pediatric Dentistry at ([phone]) [phone]. Children aged 4-14, please call (919) [phone] to request a pediatric application. ° Dental services are provided in all areas of dental care including fillings, crowns and bridges, complete and partial dentures, implants, gum treatment, root canals, and extractions. Preventive care is also provided. Treatment is provided to both adults and children. °Patients are selected via a lottery and there is often a waiting list. °  °[facility] [address], ° ° (336) 763-8833 [email or web address] °  °[facility] [address], [place], [place] (336)723-1848, Ext. 123 Second and Fourth Thursday of each month, opens at 6:30 AM; Clinic ends at 9 AM.  Patients are seen on a first-come first-served basis, and a limited number are seen during each clinic.  ° °[facility] ° [address], [place], [place] (336) 723-7904   Eligibility Requirements °You must have lived in [place], [place], or [place] counties for at least the last three months. °  You cannot be eligible for state or federal sponsored healthcare insurance, including Veterans Administration, Medicaid, or Medicare. °  You generally  cannot be eligible for healthcare insurance through your employer.  °  How to apply: °Eligibility screenings are held every Tuesday and Wednesday afternoon from 1:00 pm until 4:00 pm. You do not need an appointment for the interview!  °[facility] [address], [place],  336-631-2330   °[facility]  336-342-8273   °  [facility]  336-703-3100   °[facility]  336-570-6415   ° °Behavioral Health Resources in the Community: °Intensive Outpatient Programs °Organization         Address  Phone  Notes  °[facility] [address], [place], [place] 336-878-6098   °[facility] [address], [place], [place] 336-832-9800   °[facility] [address], [place], [place] ° 336-882-2125   °[facility] [address],  °[place], [place] 1-800-853-5163 or 336-641-4981   °Substance Abuse Resources °Organization         Address  Phone  Notes  °[facility]  336-882-2125   °[facility]  336-784-9470   °[facility]  336-285-9073   °[facility]  336-845-3988   °Residential & Outpatient Substance Abuse Program  1-800-659-3381   °Psychological Services °Organization         Address  Phone  Notes  °[facility]  336- 832-9600   °[facility]  336- 378-7881   °[facility] [address], [place] 1-800-853-5163 or 336-641-4981   ° °Mobile Crisis Teams °Organization         Address  Phone  Notes  °[facility], Mobile Crisis Care Unit  1-877-626-1772   °Assertive °[facility] ° [address] [place], [place] 336-834-9664   °Sharon DeEsch [address] °[place] 336-554-5454   ° °Self-Help/Support Groups °Organization         Address  Phone             Notes  °[facility] - variety of support groups  336- 373-1402 Call for more  information  °Narcotics Anonymous (NA), [facility] [address], °[place]  2 meetings at this location  ° °Residential Treatment Programs °Organization         Address  Phone  Notes  °[facility] [address],    °[place]  1-866-801-8205   °[facility] ° [address], [place], [place] 704-293-8524   °[facility] [address], [place] 336-845-3988 Admissions: 8am-3pm M-F  °[facility] [address]    °[place], [place] 336-841-1104   °[facility] [address], [place], [place] 336-379-7146   °[facility] [address]  °[place], [place] 336-285-9073   °[facility] Intensive Outpatient [address], [place], [place] 336-852-3033   °[facility] [address]  °[place], [place] 1-877-615-2722 or 336-784-9470   °[facility] [address] [place], [place] 336-227-7417 Accepts Medicaid  °[facility] [address]  °[place] 1-800-659-3381 Substance Abuse/Addiction Treatment  ° °[place] Behavioral Health Resources °Organization         Address  Phone  Notes  °[facility]  (888) 581-9988   °Julie Brannon, PhD [address] [place], [place]   (336) 349-5553 or (336) 951-0000   °[facility]   [address] °[place], [place] (336) 349-4454   °[facility] [address], [place],  (336) 342-8316 Insurance/Medicaid/sponsorship through [facility]  °[facility] [address]                                      [place], [place] (336) 342-8316 Therapy/tele-psych/case  °[facility] [address]  ° [place], [place] (336) 349-2233    °Dr. Arfeen  (336) 349-4544   °[facility] [place]  [employer] [place] [facility] 1) [address], [place] °2) [address], [place] °3)  [address], [place] (336)  349-3220 °(336) 342-7768 ° °(336) 342-8140   °[place] [facility] (336) 342-1394 or (336) 342-3537 (After Hours)    ° ° ° °

## 2014-06-07 LAB — URINE CULTURE: Colony Count: 30000

## 2014-06-12 LAB — CULTURE, BLOOD (ROUTINE X 2)
Culture: NO GROWTH
Culture: NO GROWTH

## 2014-06-13 NOTE — ED Provider Notes (Signed)
Medical screening examination/treatment/procedure(s) were performed by non-physician practitioner and as supervising physician I was immediately available for consultation/collaboration.   EKG Interpretation None       Loren Raceravid Jaimon Bugaj, MD 06/13/14 2350

## 2014-06-24 ENCOUNTER — Telehealth (HOSPITAL_BASED_OUTPATIENT_CLINIC_OR_DEPARTMENT_OTHER): Payer: Self-pay

## 2014-10-28 ENCOUNTER — Emergency Department (HOSPITAL_COMMUNITY)
Admission: EM | Admit: 2014-10-28 | Discharge: 2014-10-28 | Disposition: A | Discharge status: Home / Self Care | Payer: Self-pay | Attending: Emergency Medicine | Admitting: Emergency Medicine

## 2014-10-28 ENCOUNTER — Encounter (HOSPITAL_COMMUNITY): Payer: Self-pay | Admitting: Emergency Medicine

## 2014-10-28 DIAGNOSIS — Z3202 Encounter for pregnancy test, result negative: Secondary | ICD-10-CM | POA: Insufficient documentation

## 2014-10-28 DIAGNOSIS — N72 Inflammatory disease of cervix uteri: Secondary | ICD-10-CM | POA: Insufficient documentation

## 2014-10-28 DIAGNOSIS — Z8619 Personal history of other infectious and parasitic diseases: Secondary | ICD-10-CM | POA: Insufficient documentation

## 2014-10-28 DIAGNOSIS — Z87440 Personal history of urinary (tract) infections: Secondary | ICD-10-CM | POA: Insufficient documentation

## 2014-10-28 LAB — CBC WITH DIFFERENTIAL/PLATELET
BASOS ABS: 0 10*3/uL (ref 0.0–0.1)
BASOS PCT: 0 % (ref 0–1)
EOS PCT: 1 % (ref 0–5)
Eosinophils Absolute: 0.1 10*3/uL (ref 0.0–0.7)
HCT: 30.7 % — ABNORMAL LOW (ref 36.0–46.0)
Hemoglobin: 9.5 g/dL — ABNORMAL LOW (ref 12.0–15.0)
Lymphocytes Relative: 27 % (ref 12–46)
Lymphs Abs: 1.5 10*3/uL (ref 0.7–4.0)
MCH: 23.6 pg — ABNORMAL LOW (ref 26.0–34.0)
MCHC: 30.9 g/dL (ref 30.0–36.0)
MCV: 76.4 fL — AB (ref 78.0–100.0)
Monocytes Absolute: 0.4 10*3/uL (ref 0.1–1.0)
Monocytes Relative: 8 % (ref 3–12)
Neutro Abs: 3.4 10*3/uL (ref 1.7–7.7)
Neutrophils Relative %: 64 % (ref 43–77)
PLATELETS: 376 10*3/uL (ref 150–400)
RBC: 4.02 MIL/uL (ref 3.87–5.11)
RDW: 16.8 % — AB (ref 11.5–15.5)
WBC: 5.4 10*3/uL (ref 4.0–10.5)

## 2014-10-28 LAB — COMPREHENSIVE METABOLIC PANEL
ALBUMIN: 2.9 g/dL — AB (ref 3.5–5.2)
ALT: 10 U/L (ref 0–35)
AST: 18 U/L (ref 0–37)
Alkaline Phosphatase: 115 U/L (ref 39–117)
Anion gap: 11 (ref 5–15)
BUN: 7 mg/dL (ref 6–23)
CHLORIDE: 101 meq/L (ref 96–112)
CO2: 24 mEq/L (ref 19–32)
CREATININE: 0.51 mg/dL (ref 0.50–1.10)
Calcium: 9.1 mg/dL (ref 8.4–10.5)
GFR calc Af Amer: >90 mL/min (ref >90)
GFR calc non Af Amer: >90 mL/min (ref >90)
Glucose, Bld: 79 mg/dL (ref 70–99)
Potassium: 3.7 mEq/L (ref 3.7–5.3)
Sodium: 136 mEq/L — ABNORMAL LOW (ref 137–147)
TOTAL PROTEIN: 8.6 g/dL — AB (ref 6.0–8.3)
Total Bilirubin: <0.2 mg/dL — ABNORMAL LOW (ref 0.3–1.2)

## 2014-10-28 LAB — URINALYSIS, ROUTINE W REFLEX MICROSCOPIC
Bilirubin Urine: NEGATIVE
Glucose, UA: NEGATIVE mg/dL
Hgb urine dipstick: NEGATIVE
KETONES UR: NEGATIVE mg/dL
Leukocytes, UA: NEGATIVE
NITRITE: NEGATIVE
Protein, ur: NEGATIVE mg/dL
Specific Gravity, Urine: 1.027 (ref 1.005–1.030)
Urobilinogen, UA: 1 mg/dL (ref 0.0–1.0)
pH: 7.5 (ref 5.0–8.0)

## 2014-10-28 LAB — RPR

## 2014-10-28 LAB — WET PREP, GENITAL
Clue Cells Wet Prep HPF POC: NONE SEEN
TRICH WET PREP: NONE SEEN
YEAST WET PREP: NONE SEEN

## 2014-10-28 LAB — HIV ANTIBODY (ROUTINE TESTING W REFLEX): HIV: NONREACTIVE

## 2014-10-28 LAB — POC URINE PREG, ED: Preg Test, Ur: NEGATIVE

## 2014-10-28 MED ORDER — LIDOCAINE HCL (PF) 1 % IJ SOLN
5.0000 mL | Freq: Once | INTRAMUSCULAR | Status: AC
  Administered 2014-10-28: 1.2 mL
  Filled 2014-10-28: qty 5

## 2014-10-28 MED ORDER — ONDANSETRON 4 MG PO TBDP
4.0000 mg | ORAL_TABLET | Freq: Once | ORAL | Status: AC
  Administered 2014-10-28: 4 mg via ORAL
  Filled 2014-10-28: qty 1

## 2014-10-28 MED ORDER — CEFTRIAXONE SODIUM 250 MG IJ SOLR
250.0000 mg | Freq: Once | INTRAMUSCULAR | Status: AC
  Administered 2014-10-28: 250 mg via INTRAMUSCULAR
  Filled 2014-10-28: qty 250

## 2014-10-28 MED ORDER — AZITHROMYCIN 250 MG PO TABS
1000.0000 mg | ORAL_TABLET | Freq: Once | ORAL | Status: AC
  Administered 2014-10-28: 1000 mg via ORAL
  Filled 2014-10-28: qty 4

## 2014-10-28 NOTE — Discharge Instructions (Signed)
Cervicitis °Cervicitis is a soreness and swelling (inflammation) of the cervix. Your cervix is located at the bottom of your uterus. It opens up to the vagina. °CAUSES  °· Sexually transmitted infections (STIs).   °· Allergic reaction.   °· Medicines or birth control devices that are put in the vagina.   °· Injury to the cervix.   °· Bacterial infections.   °RISK FACTORS °You are at greater risk if you: °· Have unprotected sexual intercourse. °· Have sexual intercourse with many partners. °· Began sexual intercourse at an early age. °· Have a history of STIs. °SYMPTOMS  °There may be no symptoms. If symptoms occur, they may include:  °· Gray, white, yellow, or bad-smelling vaginal discharge.   °· Pain or itching of the area outside the vagina.   °· Painful sexual intercourse.   °· Lower abdominal or lower back pain, especially during intercourse.   °· Frequent urination.   °· Abnormal vaginal bleeding between periods, after sexual intercourse, or after menopause.   °· Pressure or a heavy feeling in the pelvis.   °DIAGNOSIS  °Diagnosis is made after a pelvic exam. Other tests may include:  °· Examination of any discharge under a microscope (wet prep).   °· A Pap test.   °TREATMENT  °Treatment will depend on the cause of cervicitis. If it is caused by an STI, both you and your partner will need to be treated. Antibiotic medicines will be given.  °HOME CARE INSTRUCTIONS  °· Do not have sexual intercourse until your health care provider says it is okay.   °· Do not have sexual intercourse until your partner has been treated, if your cervicitis is caused by an STI.   °· Take your antibiotics as directed. Finish them even if you start to feel better.   °SEEK MEDICAL CARE IF: °· Your symptoms come back.   °· You have a fever.   °MAKE SURE YOU:  °· Understand these instructions. °· Will watch your condition. °· Will get help right away if you are not doing well or get worse. °Document Released: 10/28/2005 Document Revised:  11/02/2013 Document Reviewed: 04/21/2013 °ExitCare® Patient Information ©2015 [employer]. This information is not intended to replace advice given to you by your health care provider. Make sure you discuss any questions you have with your health care provider. ° °

## 2014-10-28 NOTE — ED Notes (Signed)
Pt c/o lower abd pain x months; pt sts hx of PID and unsure if same

## 2014-10-28 NOTE — ED Notes (Signed)
Pt sitting on bed fully dressed awaiting disposition; no needs expressed at this time

## 2014-10-28 NOTE — ED Provider Notes (Signed)
CSN: [id number]     Arrival date & time 10/28/14  1321 History   First MD Initiated Contact with Patient 10/28/14 1502     Chief Complaint  Patient presents with  . Abdominal Pain     (Consider location/radiation/quality/duration/timing/severity/associated sxs/prior Treatment) Patient is a 28 y.o. female presenting with abdominal pain. The history is provided by the patient.  Abdominal Pain Pain location:  Suprapubic, LLQ and RLQ Pain quality: aching   Pain radiates to:  Does not radiate Pain severity:  Moderate Onset quality:  Gradual Duration: 6 months. Timing:  Constant Progression:  Waxing and waning Chronicity:  Chronic Context comment:  Was diagnosed with PID 6 months ago and treated, pain has continued Relieved by: better when moving around throghout the day. Exacerbated by: worse at night and in morning when laying down. Associated symptoms: no chest pain, no chills, no constipation, no cough, no diarrhea, no dysuria, no fatigue, no fever, no hematuria, no nausea, no shortness of breath, no sore throat, no vaginal bleeding, no vaginal discharge and no vomiting   Risk factors: not pregnant     Past Medical History  Diagnosis Date  . UTI (lower urinary tract infection)   . Lyme disease   . PID (acute pelvic inflammatory disease)    History reviewed. No pertinent past surgical history. History reviewed. No pertinent family history. History  Substance Use Topics  . Smoking status: Never Smoker   . Smokeless tobacco: Never Used  . Alcohol Use: No   OB History    No data available     Review of Systems  Constitutional: Negative for fever, chills, diaphoresis, activity change, appetite change and fatigue.  HENT: Negative for facial swelling, rhinorrhea, sore throat, trouble swallowing and voice change.   Eyes: Negative for photophobia, pain and visual disturbance.  Respiratory: Negative for cough, shortness of breath, wheezing and stridor.   Cardiovascular:  Negative for chest pain, palpitations and leg swelling.  Gastrointestinal: Positive for abdominal pain. Negative for nausea, vomiting, diarrhea, constipation, blood in stool, abdominal distention, anal bleeding and rectal pain.  Endocrine: Negative.   Genitourinary: Negative for dysuria, hematuria, vaginal bleeding, vaginal discharge and vaginal pain.  Musculoskeletal: Negative for myalgias, back pain and arthralgias.  Skin: Negative.  Negative for rash.  Allergic/Immunologic: Negative.   Neurological: Negative for dizziness, tremors, syncope, weakness and headaches.  Psychiatric/Behavioral: Negative for suicidal ideas, sleep disturbance and self-injury.  All other systems reviewed and are negative.     Allergies  Monistat  Home Medications   Prior to Admission medications   Medication Sig Start Date End Date Taking? Authorizing Provider  acetaminophen (TYLENOL) 500 MG tablet Take 500 mg by mouth every 6 (six) hours as needed for mild pain or moderate pain.   Yes Historical Provider, MD  ibuprofen (ADVIL,MOTRIN) 200 MG tablet Take 200 mg by mouth every 6 (six) hours as needed for fever or moderate pain.   Yes Historical Provider, MD  miconazole (MICOTIN) 100 MG vaginal suppository Place 100 mg vaginally at bedtime.   Yes Historical Provider, MD   BP 115/70 mmHg  Pulse 69  Temp(Src) 98.1 F (36.7 C) (Oral)  Resp 18  SpO2 100% Physical Exam  Constitutional: She is oriented to person, place, and time. She appears well-developed and well-nourished. No distress.  HENT:  Head: Normocephalic and atraumatic.  Right Ear: External ear normal.  Left Ear: External ear normal.  Mouth/Throat: Oropharynx is clear and moist. No oropharyngeal exudate.  Eyes: Conjunctivae and EOM are normal. Pupils  are equal, round, and reactive to light. No scleral icterus.  Neck: Normal range of motion. Neck supple. No JVD present. No tracheal deviation present. No thyromegaly present.  Cardiovascular: Normal  rate, regular rhythm and intact distal pulses.  Exam reveals no gallop and no friction rub.   No murmur heard. Pulmonary/Chest: Effort normal and breath sounds normal. No respiratory distress. She has no wheezes. She has no rales.  Abdominal: Soft. Bowel sounds are normal. She exhibits no distension and no mass. There is no tenderness (patient reports hard palpation of abdomen does not make pain any worse). There is no rebound and no guarding.  Genitourinary: Vagina normal. Pelvic exam was performed with patient supine. There is no rash or tenderness on the right labia. There is no rash or tenderness on the left labia. Uterus is not deviated, not enlarged and not fixed. Cervix exhibits no motion tenderness, no discharge and no friability. Right adnexum displays no mass, no tenderness and no fullness. Left adnexum displays no mass, no tenderness and no fullness. No bleeding in the vagina. No signs of injury around the vagina. No vaginal discharge found.  Musculoskeletal: Normal range of motion. She exhibits no edema or tenderness.  Neurological: She is alert and oriented to person, place, and time. No cranial nerve deficit. She exhibits normal muscle tone. Coordination normal.  Skin: Skin is warm and dry. She is not diaphoretic. No pallor.  Psychiatric: She has a normal mood and affect. She expresses no homicidal and no suicidal ideation. She expresses no suicidal plans and no homicidal plans.  Nursing note and vitals reviewed.   ED Course  Procedures (including critical care time) Labs Review Labs Reviewed  WET PREP, GENITAL - Abnormal; Notable for the following:    WBC, Wet Prep HPF POC MODERATE (*)    All other components within normal limits  CBC WITH DIFFERENTIAL - Abnormal; Notable for the following:    Hemoglobin 9.5 (*)    HCT 30.7 (*)    MCV 76.4 (*)    MCH 23.6 (*)    RDW 16.8 (*)    All other components within normal limits  COMPREHENSIVE METABOLIC PANEL - Abnormal; Notable for  the following:    Sodium 136 (*)    Total Protein 8.6 (*)    Albumin 2.9 (*)    Total Bilirubin <0.2 (*)    All other components within normal limits  GC/CHLAMYDIA PROBE AMP  URINALYSIS, ROUTINE W REFLEX MICROSCOPIC  RPR  HIV ANTIBODY (ROUTINE TESTING)  POC URINE PREG, ED    Imaging Review No results found.   EKG Interpretation None      MDM   Final diagnoses:  Cervicitis    The patient is a 28 y.o. F who presents with 6 months of lower abdominal pain. The patient was treated for PID 6 months ago but reports no improvement (or worsening) of her symptoms. Patient afebrile and tolerating PO well. Exam very benign as above with no abdominal tenderness and normal pelvic exam. Wet prep shows WBC but is otherwise negative. Do not suspect PID, TOA, ovarian torsion based on exam. Feel patient likely has uncomplicated cervicitis and will treat with IM ceftriaxone and 1g PO azithromycin x 1.  I estimate there is LOW risk for ACUTE APPENDICITIS, BOWEL OBSTRUCTION, CHOLECYSTITIS, DIVERTICULITIS, INCARCERATED HERNIA, MESENTERIC ISCHEMIA, PANCREATITIS, or PERFORATED BOWEL or ULCER, thus I consider the discharge disposition reasonable. Also, there is no evidence or peritonitis, sepsis or toxicity. We have discussed the diagnosis and risks and  agree with discharging home to follow-up with their primary doctor. We also discussed returning to the Emergency Department immediately if new or worsening symptoms occur and have discussed the symptoms which are most concerning (e.g., bloody stool, fever, changing or worsening pain, vomiting) that necessitate immediate return.  Patient seen with attending, Dr. Karma GanjaLinker, who oversaw clinical decision making.     Lula OlszewskiMike Pura Picinich, MD 10/28/14 1638  Ethelda ChickMartha K Linker, MD 10/28/14 1700

## 2014-10-28 NOTE — ED Notes (Addendum)
Was dx with Lyme Disease in July, also dx with pelvic infection . Finished meds, still had pain, had further cultures that were negative. Has had continued pain since July. Low abd pain, more on right side, no vaginal discharge, itching , burning . Last period started on Monday. Using condoms for birth control.

## 2014-10-28 NOTE — ED Notes (Signed)
MD at bedside. 

## 2014-10-29 LAB — GC/CHLAMYDIA PROBE AMP
CT PROBE, AMP APTIMA: NEGATIVE
GC Probe RNA: NEGATIVE

## 2014-11-14 ENCOUNTER — Ambulatory Visit: Payer: Self-pay | Admitting: Family Medicine

## 2015-01-03 ENCOUNTER — Ambulatory Visit: Payer: Self-pay | Admitting: Family Medicine

## 2015-09-16 ENCOUNTER — Encounter (HOSPITAL_COMMUNITY): Payer: Self-pay

## 2015-09-16 ENCOUNTER — Emergency Department (HOSPITAL_COMMUNITY)
Admission: EM | Admit: 2015-09-16 | Discharge: 2015-09-16 | Disposition: A | Discharge status: Home / Self Care | Payer: Self-pay | Attending: Emergency Medicine | Admitting: Emergency Medicine

## 2015-09-16 DIAGNOSIS — Z87440 Personal history of urinary (tract) infections: Secondary | ICD-10-CM | POA: Insufficient documentation

## 2015-09-16 DIAGNOSIS — Z3202 Encounter for pregnancy test, result negative: Secondary | ICD-10-CM | POA: Insufficient documentation

## 2015-09-16 DIAGNOSIS — Z880 Allergy status to penicillin: Secondary | ICD-10-CM | POA: Insufficient documentation

## 2015-09-16 DIAGNOSIS — Z8742 Personal history of other diseases of the female genital tract: Secondary | ICD-10-CM | POA: Insufficient documentation

## 2015-09-16 DIAGNOSIS — Z8619 Personal history of other infectious and parasitic diseases: Secondary | ICD-10-CM | POA: Insufficient documentation

## 2015-09-16 DIAGNOSIS — R109 Unspecified abdominal pain: Secondary | ICD-10-CM

## 2015-09-16 DIAGNOSIS — R1032 Left lower quadrant pain: Secondary | ICD-10-CM | POA: Insufficient documentation

## 2015-09-16 LAB — COMPREHENSIVE METABOLIC PANEL
ALT: 16 U/L (ref 14–54)
AST: 26 U/L (ref 15–41)
Albumin: 3.6 g/dL (ref 3.5–5.0)
Alkaline Phosphatase: 106 U/L (ref 38–126)
Anion gap: 6 (ref 5–15)
BILIRUBIN TOTAL: 0.3 mg/dL (ref 0.3–1.2)
BUN: 9 mg/dL (ref 6–20)
CO2: 26 mmol/L (ref 22–32)
CREATININE: 0.73 mg/dL (ref 0.44–1.00)
Calcium: 8.9 mg/dL (ref 8.9–10.3)
Chloride: 104 mmol/L (ref 101–111)
Glucose, Bld: 67 mg/dL (ref 65–99)
Potassium: 3.7 mmol/L (ref 3.5–5.1)
Sodium: 136 mmol/L (ref 135–145)
TOTAL PROTEIN: 8.8 g/dL — AB (ref 6.5–8.1)

## 2015-09-16 LAB — CBC WITH DIFFERENTIAL/PLATELET
BASOS PCT: 0 %
Basophils Absolute: 0 10*3/uL (ref 0.0–0.1)
Eosinophils Absolute: 0.1 10*3/uL (ref 0.0–0.7)
Eosinophils Relative: 2 %
HEMATOCRIT: 36.1 % (ref 36.0–46.0)
Hemoglobin: 11.5 g/dL — ABNORMAL LOW (ref 12.0–15.0)
LYMPHS ABS: 2.4 10*3/uL (ref 0.7–4.0)
LYMPHS PCT: 35 %
MCH: 24.9 pg — AB (ref 26.0–34.0)
MCHC: 31.9 g/dL (ref 30.0–36.0)
MCV: 78.1 fL (ref 78.0–100.0)
MONOS PCT: 10 %
Monocytes Absolute: 0.7 10*3/uL (ref 0.1–1.0)
NEUTROS ABS: 3.6 10*3/uL (ref 1.7–7.7)
Neutrophils Relative %: 53 %
Platelets: 419 10*3/uL — ABNORMAL HIGH (ref 150–400)
RBC: 4.62 MIL/uL (ref 3.87–5.11)
RDW: 16.2 % — AB (ref 11.5–15.5)
WBC: 6.9 10*3/uL (ref 4.0–10.5)

## 2015-09-16 LAB — URINALYSIS, ROUTINE W REFLEX MICROSCOPIC
BILIRUBIN URINE: NEGATIVE
Glucose, UA: NEGATIVE mg/dL
Hgb urine dipstick: NEGATIVE
Ketones, ur: NEGATIVE mg/dL
Leukocytes, UA: NEGATIVE
NITRITE: NEGATIVE
PROTEIN: NEGATIVE mg/dL
SPECIFIC GRAVITY, URINE: 1.028 (ref 1.005–1.030)
Urobilinogen, UA: 0.2 mg/dL (ref 0.0–1.0)
pH: 5.5 (ref 5.0–8.0)

## 2015-09-16 LAB — RAPID URINE DRUG SCREEN, HOSP PERFORMED
Amphetamines: NOT DETECTED
Barbiturates: NOT DETECTED
Benzodiazepines: NOT DETECTED
COCAINE: NOT DETECTED
OPIATES: NOT DETECTED
TETRAHYDROCANNABINOL: NOT DETECTED

## 2015-09-16 MED ORDER — OXYCODONE-ACETAMINOPHEN 5-325 MG PO TABS
2.0000 | ORAL_TABLET | Freq: Once | ORAL | Status: DC
  Filled 2015-09-16: qty 2

## 2015-09-16 MED ORDER — PROMETHAZINE HCL 25 MG PO TABS: 25.0000 mg | ORAL_TABLET | Freq: Four times a day (QID) | ORAL | Status: AC | PRN

## 2015-09-16 MED ORDER — OXYCODONE-ACETAMINOPHEN 5-325 MG PO TABS: 1.0000 | ORAL_TABLET | ORAL | Status: AC | PRN

## 2015-09-16 NOTE — ED Notes (Signed)
She c/o llq area abd. Discomfort which is made worse by some positional changes.  She denies n/v/d/fever/dysuria and is in no distress.

## 2015-09-16 NOTE — ED Provider Notes (Signed)
CSN: [id number]     Arrival date & time 09/16/15  1739 History   First MD Initiated Contact with Patient 09/16/15 1811     Chief Complaint  Patient presents with  . Abdominal Pain     (Consider location/radiation/quality/duration/timing/severity/associated sxs/prior Treatment) HPI,,,,, left lower quadrant abdominal pain intermittently for 3 days. Last menstrual period October 12. Normal urinary output. Normal bowel movements. No vaginal bleeding or discharge. No fever, sweats, chills, dysuria. She has had this pain before and "no one has figured it out". She is eating.  Past Medical History  Diagnosis Date  . UTI (lower urinary tract infection)   . Lyme disease   . PID (acute pelvic inflammatory disease)    No past surgical history on file. No family history on file. Social History  Substance Use Topics  . Smoking status: Never Smoker   . Smokeless tobacco: Never Used  . Alcohol Use: No   OB History    No data available     Review of Systems  All other systems reviewed and are negative.     Allergies  Monistat and Penicillins  Home Medications   Prior to Admission medications   Medication Sig Start Date End Date Taking? Authorizing Provider  acetaminophen (TYLENOL) 500 MG tablet Take 500 mg by mouth every 6 (six) hours as needed for mild pain or moderate pain.   Yes Historical Provider, MD  ibuprofen (ADVIL,MOTRIN) 200 MG tablet Take 200 mg by mouth every 6 (six) hours as needed for fever or moderate pain.   Yes Historical Provider, MD  oxyCODONE-acetaminophen (PERCOCET/ROXICET) 5-325 MG tablet Take 1-2 tablets by mouth every 4 (four) hours as needed for severe pain. 09/16/15   Donnetta Hutching, MD  promethazine (PHENERGAN) 25 MG tablet Take 1 tablet (25 mg total) by mouth every 6 (six) hours as needed for nausea or vomiting. 09/16/15   Donnetta Hutching, MD   BP 127/76 mmHg  Pulse 84  Resp 18  SpO2 99%  LMP 08/21/2015 (Approximate) Physical Exam  Constitutional: She is  oriented to person, place, and time. She appears well-developed and well-nourished.  HENT:  Head: Normocephalic and atraumatic.  Eyes: Conjunctivae and EOM are normal. Pupils are equal, round, and reactive to light.  Neck: Normal range of motion. Neck supple.  Cardiovascular: Normal rate and regular rhythm.   Pulmonary/Chest: Effort normal and breath sounds normal.  Abdominal:  Min LLQ tenderness  Musculoskeletal: Normal range of motion.  Neurological: She is alert and oriented to person, place, and time.  Skin: Skin is warm and dry.  Psychiatric: She has a normal mood and affect. Her behavior is normal.  Nursing note and vitals reviewed.   ED Course  Procedures (including critical care time) Labs Review Labs Reviewed  CBC WITH DIFFERENTIAL/PLATELET - Abnormal; Notable for the following:    Hemoglobin 11.5 (*)    MCH 24.9 (*)    RDW 16.2 (*)    Platelets 419 (*)    All other components within normal limits  COMPREHENSIVE METABOLIC PANEL - Abnormal; Notable for the following:    Total Protein 8.8 (*)    All other components within normal limits  URINALYSIS, ROUTINE W REFLEX MICROSCOPIC (NOT AT [facility])  URINE RAPID DRUG SCREEN, HOSP PERFORMED    Imaging Review No results found. I have personally reviewed and evaluated these images and lab results as part of my medical decision-making.   EKG Interpretation None      MDM   Final diagnoses:  Abdominal pain, unspecified  abdominal location    No acute abdomen. Screening tests including CBC, chemistry, urinalysis, pregnancy test all negative. Discharge medication Percocet and Phenergan 25 mg.    Donnetta HutchingBrian Fawaz Borquez, MD 09/16/15 2132

## 2015-09-16 NOTE — Discharge Instructions (Signed)
Tests were normal.  Meds for pain and nausea.  Return if worse.

## 2015-09-16 NOTE — ED Notes (Addendum)
Pt reports sharp lower left abdominal pain; denies GU/GI complaint. Pt reports LMP 10/12, no abnormal vaginal/urinary odor/discharge, last BM today, passing gas normally, and denies n/v/d.   Pt denies pain at present time, but reports constant ongoing pain with movement e.g. Walking.

## 2015-12-02 ENCOUNTER — Emergency Department (HOSPITAL_COMMUNITY): Payer: Self-pay

## 2015-12-02 ENCOUNTER — Emergency Department (HOSPITAL_COMMUNITY)
Admission: EM | Admit: 2015-12-02 | Discharge: 2015-12-02 | Disposition: A | Discharge status: Home / Self Care | Payer: Self-pay | Attending: Emergency Medicine | Admitting: Emergency Medicine

## 2015-12-02 ENCOUNTER — Encounter (HOSPITAL_COMMUNITY): Payer: Self-pay | Admitting: *Deleted

## 2015-12-02 DIAGNOSIS — S199XXA Unspecified injury of neck, initial encounter: Secondary | ICD-10-CM | POA: Insufficient documentation

## 2015-12-02 DIAGNOSIS — Y998 Other external cause status: Secondary | ICD-10-CM | POA: Insufficient documentation

## 2015-12-02 DIAGNOSIS — Z3202 Encounter for pregnancy test, result negative: Secondary | ICD-10-CM | POA: Insufficient documentation

## 2015-12-02 DIAGNOSIS — Z8742 Personal history of other diseases of the female genital tract: Secondary | ICD-10-CM | POA: Insufficient documentation

## 2015-12-02 DIAGNOSIS — Y9389 Activity, other specified: Secondary | ICD-10-CM | POA: Insufficient documentation

## 2015-12-02 DIAGNOSIS — Z880 Allergy status to penicillin: Secondary | ICD-10-CM | POA: Insufficient documentation

## 2015-12-02 DIAGNOSIS — M542 Cervicalgia: Secondary | ICD-10-CM

## 2015-12-02 DIAGNOSIS — R42 Dizziness and giddiness: Secondary | ICD-10-CM | POA: Insufficient documentation

## 2015-12-02 DIAGNOSIS — Z8619 Personal history of other infectious and parasitic diseases: Secondary | ICD-10-CM | POA: Insufficient documentation

## 2015-12-02 DIAGNOSIS — Z87440 Personal history of urinary (tract) infections: Secondary | ICD-10-CM | POA: Insufficient documentation

## 2015-12-02 DIAGNOSIS — Y9289 Other specified places as the place of occurrence of the external cause: Secondary | ICD-10-CM | POA: Insufficient documentation

## 2015-12-02 LAB — I-STAT CHEM 8, ED
BUN: 8 mg/dL (ref 6–20)
CREATININE: 0.6 mg/dL (ref 0.44–1.00)
Calcium, Ion: 1.15 mmol/L (ref 1.12–1.23)
Chloride: 105 mmol/L (ref 101–111)
Glucose, Bld: 86 mg/dL (ref 65–99)
HEMATOCRIT: 37 % (ref 36.0–46.0)
Hemoglobin: 12.6 g/dL (ref 12.0–15.0)
Potassium: 4.1 mmol/L (ref 3.5–5.1)
Sodium: 139 mmol/L (ref 135–145)
TCO2: 23 mmol/L (ref 0–100)

## 2015-12-02 LAB — POC URINE PREG, ED: Preg Test, Ur: NEGATIVE

## 2015-12-02 MED ORDER — HYDROCODONE-ACETAMINOPHEN 5-325 MG PO TABS
2.0000 | ORAL_TABLET | Freq: Once | ORAL | Status: AC
  Administered 2015-12-02: 2 via ORAL
  Filled 2015-12-02: qty 2

## 2015-12-02 MED ORDER — HYDROCODONE-ACETAMINOPHEN 5-325 MG PO TABS: 1.0000 | ORAL_TABLET | ORAL | Status: AC | PRN

## 2015-12-02 MED ORDER — IOHEXOL 300 MG/ML  SOLN
75.0000 mL | Freq: Once | INTRAMUSCULAR | Status: AC | PRN
  Administered 2015-12-02: 75 mL via INTRAVENOUS

## 2015-12-02 MED ORDER — IBUPROFEN 800 MG PO TABS: 800.0000 mg | ORAL_TABLET | Freq: Three times a day (TID) | ORAL | Status: DC

## 2015-12-02 MED ORDER — IBUPROFEN 400 MG PO TABS
800.0000 mg | ORAL_TABLET | Freq: Once | ORAL | Status: AC
  Administered 2015-12-02: 800 mg via ORAL
  Filled 2015-12-02: qty 2
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
## 2015-12-02 NOTE — Discharge Instructions (Signed)
1. Medications: ibuprofen, vicodin, usual home medications 2. Treatment: rest, drink plenty of fluids, ice 3. Follow Up: please followup with your primary doctor for discussion of your diagnoses and further evaluation of your CT findings; if you do not have a primary care doctor use the resource guide provided to find one; please return to the ER for increased pain or swelling, difficulty swallowing or handling your secretions, new or worsening symptoms  IMPRESSION: 1. No acute abnormality identified within the neck. 2. 1.9 x 3.1 cm nodal conglomerate within the right supraclavicular region. Finding is indeterminate, and may be reactive in nature. Possible lymphoproliferative disorder or nodal metastases could also have this appearance as well. Clinical followup with possible short interval follow-up imaging to evaluate for stability and/or resolution is recommended. 3. Small amount of layering debris within the trachea just above the carina. Finding suggests this patient may be at risk for aspiration. Query underlying reflux.  Musculoskeletal Pain Musculoskeletal pain is muscle and boney aches and pains. These pains can occur in any part of the body. Your caregiver may treat you without knowing the cause of the pain. They may treat you if blood or urine tests, X-rays, and other tests were normal.  CAUSES There is often not a definite cause or reason for these pains. These pains may be caused by a type of germ (virus). The discomfort may also come from overuse. Overuse includes working out too hard when your body is not fit. Boney aches also come from weather changes. Bone is sensitive to atmospheric pressure changes. HOME CARE INSTRUCTIONS   Ask when your test results will be ready. Make sure you get your test results.  Only take over-the-counter or prescription medicines for pain, discomfort, or fever as directed by your caregiver. If you were given medications for your condition, do not  drive, operate machinery or power tools, or sign legal documents for 24 hours. Do not drink alcohol. Do not take sleeping pills or other medications that may interfere with treatment.  Continue all activities unless the activities cause more pain. When the pain lessens, slowly resume normal activities. Gradually increase the intensity and duration of the activities or exercise.  During periods of severe pain, bed rest may be helpful. Lay or sit in any position that is comfortable.  Putting ice on the injured area.  Put ice in a bag.  Place a towel between your skin and the bag.  Leave the ice on for 15 to 20 minutes, 3 to 4 times a day.  Follow up with your caregiver for continued problems and no reason can be found for the pain. If the pain becomes worse or does not go away, it may be necessary to repeat tests or do additional testing. Your caregiver may need to look further for a possible cause. SEEK IMMEDIATE MEDICAL CARE IF:  You have pain that is getting worse and is not relieved by medications.  You develop chest pain that is associated with shortness or breath, sweating, feeling sick to your stomach (nauseous), or throw up (vomit).  Your pain becomes localized to the abdomen.  You develop any new symptoms that seem different or that concern you. MAKE SURE YOU:   Understand these instructions.  Will watch your condition.  Will get help right away if you are not doing well or get worse.   This information is not intended to replace advice given to you by your health care provider. Make sure you discuss any questions you have with  your health care provider.   Document Released: 10/28/2005 Document Revised: 01/20/2012 Document Reviewed: 07/02/2013 Elsevier Interactive Patient Education 2016 [employer].    Emergency Department Resource Guide 1) Find a Doctor and Pay Out of Pocket Although you won't have to find out who is covered by your insurance plan, it is a good idea  to ask around and get recommendations. You will then need to call the office and see if the doctor you have chosen will accept you as a new patient and what types of options they offer for patients who are self-pay. Some doctors offer discounts or will set up payment plans for their patients who do not have insurance, but you will need to ask so you aren't surprised when you get to your appointment.  2) Contact Your Local Health Department Not all health departments have doctors that can see patients for sick visits, but many do, so it is worth a call to see if yours does. If you don't know where your local health department is, you can check in your phone book. The CDC also has a tool to help you locate your state's health department, and many state websites also have listings of all of their local health departments.  3) Find a Walk-in Clinic If your illness is not likely to be very severe or complicated, you may want to try a walk in clinic. These are popping up all over the country in pharmacies, drugstores, and shopping centers. They're usually staffed by nurse practitioners or physician assistants that have been trained to treat common illnesses and complaints. They're usually fairly quick and inexpensive. However, if you have serious medical issues or chronic medical problems, these are probably not your best option.  No Primary Care Doctor: - Call Health Connect at  [phone] - they can help you locate a primary care doctor that  accepts your insurance, provides certain services, etc. - Physician Referral Service- [phone]  Chronic Pain Problems: Organization         Address  Phone   Notes  [facility]  [phone] Patients need to be referred by their primary care doctor.   Medication Assistance: Organization         Address  Phone   Notes  [facility] Medication [facility] [address] [place] [place], [place] [address] [phone] --Must  be a resident of [facility] -- Must have NO insurance coverage whatsoever (no Medicaid/ Medicare, etc.) -- The pt. MUST have a primary care doctor that directs their care regularly and follows them in the community   [facility]  [phone]   [employer]  [phone]    Agencies that provide inexpensive medical care: Organization         Address  Phone   Notes  [facility]  [phone]   [facility]    [phone]   [facility] [address] [place], [place] [address] [phone]   [facility] [address], [place] [phone]   Planned Parenthood    [phone]   [facility]    [phone]   Community Health and [facility]  [address], [place] Phone:  [phone], Fax:  [phone] Hours of Operation:  9 am - 6 pm, M-F.  Also accepts Medicaid/Medicare and self-pay.  [facility]  [address], [place] Phone: [phone], Fax: [phone]. Hours of Operation:  8:30 am - 5:30 pm, M-F.  Also accepts Medicaid and self-pay.  [facility] [place] [address] [place] Phone: [phone]   [facility] [address] Natasha Bence [place], [place] [phone], Ext. [phone] Mondays & Thursdays: 7-9 AM.  First 15 patients are seen on a first come, first serve basis.    Medicaid-accepting [facility] Providers:  Organization         Address  Phone   Notes  [facility] [address], [place] [phone] Also accepts self-pay patients.  [facility] [address] Laurell Josephs [place], [place]  [phone]   [facility] [address], [place] [phone]   [facility] [address], [place] [phone]   Renaye Rakers [address], [place]   [phone] Only accepts  [place] Access [place] patients after they have their name applied to their card.   Self-Pay (no insurance) in [facility]  Organization         Address  Phone   Notes  Sickle Cell Patients, [facility] Medicine [address] [place], [place] [phone]   [facility] Urgent Care [address] [place], [place] [phone]   [facility]  [address], [place] [phone]   [facility]/Dr. Osei-Bonsu  [address], [place] or [address], [place] [phone] Phone number for both [place] and [place] locations is the same.  Urgent Medical and [facility] [address], [place] [phone]   [facility] [address], [place] or [address] [phone]   [facility] [address] [place] [phone], phone; [phone], fax Sees patients 1st and 3rd Saturday of every month.  Must not qualify for public or private insurance (i.e. Medicaid, Medicare, [place] Health Choice, Veterans' Benefits)  Household income should be no more than 200% of the poverty level The clinic cannot treat you if you are pregnant or think you are pregnant  Sexually transmitted diseases are not treated at the clinic.    Dental Care: Organization         Address  Phone  Notes  [facility] Department of [facility] [address] [place], [place] [phone] Accepts children up to age 59 who are enrolled in [place] or [place] Health Choice; pregnant women with a Medicaid card; and children who have applied for Medicaid or [place] Health Choice, but were declined, whose parents can pay a reduced fee at time of service.  [facility] Department of [facility]  [address], [place] [phone] Accepts children up to age 28 who are enrolled in [place] or [place] Health Choice; pregnant women  with a Medicaid card; and children who have applied for Medicaid or  Health Choice, but were declined, whose parents can pay a reduced fee at time of service.  [facility]  [address] [place], [place] [phone] Patients are seen by appointment only. Walk-ins are not accepted. [facility] will see patients 81 years of age and older. Monday - Tuesday (8am-5pm) Most Wednesdays (8:30-5pm) $30 per visit, cash only  [facility]  [address],  [place] [phone] Patients are seen by appointment only. Walk-ins are not accepted. [facility] will see patients 24 years of age and older. One Wednesday Evening (Monthly: Volunteer Based).  $30 per visit, cash only  [employer]  [phone] for adults; Children under age 28, call Graduate Pediatric Dentistry at [phone]. Children aged 58-14, please call [phone] to request a pediatric application.  Dental services are provided in all areas of dental care including fillings, crowns and bridges, complete and partial dentures, implants, gum treatment, root canals, and extractions. Preventive care is also provided. Treatment is provided to both adults and children. Patients are selected via a lottery and there is often a waiting list.   [facility] [address], [place]  [phone] [email or web address]   [facility] [address] [place], [place] [phone], Ext. [phone] Second and Fourth Thursday of each month, opens at 6:30 AM; Clinic ends at 9 AM.  Patients are seen on a first-come first-served basis, and a limited number are seen during each clinic.   [facility]  [address] Ether Griffins [place], [place] [phone]   Eligibility Requirements You must have lived in [place], [place], or [place] counties for at least the last three months.   You cannot be eligible for state or federal sponsored [employer], including [employer], [place], or [employer].   You generally cannot be eligible for healthcare insurance through your employer.    How to apply: Eligibility screenings are held every Tuesday and Wednesday afternoon from 1:00 pm until 4:00 pm. You do not need an appointment for the interview!  [facility] [address] [place], [place] [phone]   [facility] Health Department  [phone]   [facility]  [phone]   [facility] Health Department  [phone]    Behavioral Health Resources in the Community: Intensive Outpatient Programs Organization         Address  Phone  Notes  [facility] [address], [place], [place] [phone]   [facility] [address], [place], [place] [phone]   [facility] [address], [place], [place]  [phone]   [facility] Mental Health [address],  [place], [place] [phone] or [phone]   Substance Abuse Resources Organization         Address  Phone  Notes  [facility]  [phone]   [facility]  [phone]   [facility]  [phone]   Floydene Flock  [phone]   Residential & Outpatient Substance Abuse Program  [phone]   Psychological Services Organization         Address  Phone  Notes  [facility] Behavioral Health  [phone]   [facility]  [phone]   [facility] [address], [place] [phone] or [phone]    Mobile Crisis Teams Organization         Address  Phone  Notes  [facility], Mobile Crisis Care Unit  [phone]   [facility]  [address] [place], [place] [phone]   Doristine Locks [address] [place] [phone]    Self-Help/Support Groups Organization         Address  Phone             Notes  [facility] [place] - variety of support  groups  [phone] Call for more information  Narcotics Anonymous (NA), [facility] [address], [employer] [place]  2 meetings at this location   Residential Sports administrator         Address  Phone  Notes  [facility] [address],    [place]  [phone]   [facility]  [address], [place] [address], [place], [place] [phone]   [facility] [address] [place], [place] [phone] Admissions: 8am-3pm M-F  [facility] [address],    [place], [place] [phone]   [facility] [address] [place], [place], [place] [phone]   [facility] [address],  [place], [place] [phone]   [facility] - Intensive Outpatient [address] Laurell Josephs [address], [place], [place] [phone]   [facility] ([facility] [address] [place]  [place], [place] [phone] or [phone]   [facility] ([facility]) [address], , [place] [phone] Accepts Medicaid  [facility] [address]  [place] [phone] Substance Abuse/Addiction Treatment   [facility] Organization         Address  Phone  Notes  [facility]  [phone]   Angie Fava, PhD [address] Ervin Knack [place], [place]   [phone] or [phone]   [facility] Behavioral   [address] [place], [place] [phone]   [facility] [address] [place], [place] [phone] Insurance/Medicaid/sponsorship through [facility] and Families [address]                                    [place], [place] [phone] Therapy/tele-psych/case  [facility] [address], [place] [phone]    Dr. Lolly Mustache  [phone]   [facility]  [employer] [facility] Dept. 1) [address]   2) [address], [place] 3)  [address], [place] [phone]  [phone]   [facility] Child Abuse Hotline 724-139-6182 or [phone] (After Hours)
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
## 2016-01-02 ENCOUNTER — Emergency Department (HOSPITAL_COMMUNITY)
Admission: EM | Admit: 2016-01-02 | Discharge: 2016-01-02 | Disposition: A | Discharge status: Home / Self Care | Payer: Self-pay | Attending: Emergency Medicine | Admitting: Emergency Medicine

## 2016-01-02 ENCOUNTER — Encounter (HOSPITAL_COMMUNITY): Payer: Self-pay | Admitting: Emergency Medicine

## 2016-01-02 DIAGNOSIS — Z87440 Personal history of urinary (tract) infections: Secondary | ICD-10-CM | POA: Insufficient documentation

## 2016-01-02 DIAGNOSIS — Z8742 Personal history of other diseases of the female genital tract: Secondary | ICD-10-CM | POA: Insufficient documentation

## 2016-01-02 DIAGNOSIS — B9789 Other viral agents as the cause of diseases classified elsewhere: Secondary | ICD-10-CM

## 2016-01-02 DIAGNOSIS — J069 Acute upper respiratory infection, unspecified: Secondary | ICD-10-CM | POA: Insufficient documentation

## 2016-01-02 DIAGNOSIS — Z8619 Personal history of other infectious and parasitic diseases: Secondary | ICD-10-CM | POA: Insufficient documentation

## 2016-01-02 DIAGNOSIS — Z880 Allergy status to penicillin: Secondary | ICD-10-CM | POA: Insufficient documentation

## 2016-01-02 DIAGNOSIS — R42 Dizziness and giddiness: Secondary | ICD-10-CM | POA: Insufficient documentation

## 2016-01-02 LAB — RAPID STREP SCREEN (MED CTR MEBANE ONLY): STREPTOCOCCUS, GROUP A SCREEN (DIRECT): NEGATIVE

## 2016-01-02 NOTE — ED Provider Notes (Signed)
CSN: [id number]     Arrival date & time 01/02/16  1845 History  By signing my name below, I, Bethel Born, attest that this documentation has been prepared under the direction and in the presence of Felicie Morn NP. Electronically Signed: Bethel Born, ED Scribe. 01/02/2016 8:27 PM   Chief Complaint  Patient presents with  . Sore Throat    Patient is a 31 y.o. female presenting with pharyngitis. The history is provided by the patient. No language interpreter was used.  Sore Throat This is a new problem. The current episode started yesterday. The problem occurs constantly. The problem has not changed since onset.Pertinent negatives include no chest pain, no abdominal pain, no headaches and no shortness of breath. Nothing aggravates the symptoms. Nothing relieves the symptoms. She has tried nothing for the symptoms.   Marie Howell is a 30 y.o. female who presents to the Emergency Department complaining of a new, constant, 8/10 in severity sore throat with onset yesterday. Associated symptoms include subjective fever, dizziness, rhinorrhea, cough, and myalgias. Pt denies nausea, vomiting, and diarrhea. She states that she may have had a sick contact at work. Pt is otherwise healthy and takes no daily medication.   Past Medical History  Diagnosis Date  . UTI (lower urinary tract infection)   . Lyme disease   . PID (acute pelvic inflammatory disease)    History reviewed. No pertinent past surgical history. History reviewed. No pertinent family history. Social History  Substance Use Topics  . Smoking status: Never Smoker   . Smokeless tobacco: Never Used  . Alcohol Use: No   OB History    No data available     Review of Systems  Constitutional: Positive for fever (subjective).  HENT: Positive for rhinorrhea and sore throat. Negative for trouble swallowing.   Respiratory: Positive for cough. Negative for shortness of breath.   Cardiovascular: Negative for chest pain.   Gastrointestinal: Negative for nausea, vomiting, abdominal pain and diarrhea.  Musculoskeletal: Positive for myalgias.  Neurological: Positive for dizziness. Negative for headaches.  All other systems reviewed and are negative.     Allergies  Monistat and Penicillins  Home Medications   Prior to Admission medications   Medication Sig Start Date End Date Taking? Authorizing Provider  acetaminophen (TYLENOL) 500 MG tablet Take 500 mg by mouth every 6 (six) hours as needed for mild pain or moderate pain.    Historical Provider, MD  HYDROcodone-acetaminophen (NORCO/VICODIN) 5-325 MG tablet Take 1 tablet by mouth every 4 (four) hours as needed. 12/02/15   Mady Gemma, PA-C  ibuprofen (ADVIL,MOTRIN) 800 MG tablet Take 1 tablet (800 mg total) by mouth 3 (three) times daily. 12/02/15   Mady Gemma, PA-C  oxyCODONE-acetaminophen (PERCOCET/ROXICET) 5-325 MG tablet Take 1-2 tablets by mouth every 4 (four) hours as needed for severe pain. 09/16/15   Donnetta Hutching, MD  promethazine (PHENERGAN) 25 MG tablet Take 1 tablet (25 mg total) by mouth every 6 (six) hours as needed for nausea or vomiting. 09/16/15   Donnetta Hutching, MD   BP 116/83 mmHg  Pulse 99  Temp(Src) 100.3 F (37.9 C) (Oral)  Resp 16  Ht 5' 2.99" (1.6 m)  Wt 170 lb (77.111 kg)  BMI 30.12 kg/m2  SpO2 99%  LMP 01/01/2016 (Exact Date) Physical Exam  Constitutional: She is oriented to person, place, and time. She appears well-developed and well-nourished. No distress.  HENT:  Head: Normocephalic and atraumatic.  Mouth/Throat: Posterior oropharyngeal erythema present. No oropharyngeal exudate.  Eyes:  Conjunctivae and EOM are normal.  Neck: Neck supple. No tracheal deviation present.  Cardiovascular: Normal rate.   Pulmonary/Chest: Effort normal. No respiratory distress.  Musculoskeletal: Normal range of motion.  Lymphadenopathy:    She has no cervical adenopathy.  Neurological: She is alert and oriented to person, place,  and time.  Skin: Skin is warm and dry.  Psychiatric: She has a normal mood and affect. Her behavior is normal.  Nursing note and vitals reviewed.   ED Course  Procedures (including critical care time) DIAGNOSTIC STUDIES: Oxygen Saturation is 99% on RA,  normal by my interpretation.    COORDINATION OF CARE: 7:44 PM Discussed treatment plan which includes rapid strep screen and culture with pt at bedside and pt agreed to plan.  Labs Review Labs Reviewed  RAPID STREP SCREEN (NOT AT [facility])  CULTURE, GROUP A STREP [facility])    Imaging Review No results found. I have personally reviewed and evaluated these lab results as part of my medical decision-making.   EKG Interpretation None      MDM   Final diagnoses:  Viral URI with cough    Patients symptoms are consistent with URI, likely viral etiology. Discussed that antibiotics are not indicated for viral infections. Pt will be discharged with symptomatic treatment.  Verbalizes understanding and is agreeable with plan. Pt is hemodynamically stable & in NAD prior to dc.   I personally performed the services described in this documentation, which was scribed in my presence. The recorded information has been reviewed and is accurate.    Felicie Morn, NP 01/03/16 0010  Raeford Razor, MD 01/04/16 [phone]

## 2016-01-02 NOTE — ED Notes (Signed)
Patient here with complaint of sore throat x1 days. Denies difficulty breathing. Endorses subjective fever at home with 100.3 measured here. Additionally endorses cough, upper respiratory congestion.

## 2016-01-02 NOTE — Discharge Instructions (Signed)
Upper Respiratory Infection, Adult °Most upper respiratory infections (URIs) are a viral infection of the air passages leading to the lungs. A URI affects the nose, throat, and upper air passages. The most common type of URI is nasopharyngitis and is typically referred to as "the common cold." °URIs run their course and usually go away on their own. Most of the time, a URI does not require medical attention, but sometimes a bacterial infection in the upper airways can follow a viral infection. This is called a secondary infection. Sinus and middle ear infections are common types of secondary upper respiratory infections. °Bacterial pneumonia can also complicate a URI. A URI can worsen asthma and chronic obstructive pulmonary disease (COPD). Sometimes, these complications can require emergency medical care and may be life threatening.  °CAUSES °Almost all URIs are caused by viruses. A virus is a type of germ and can spread from one person to another.  °RISKS FACTORS °You may be at risk for a URI if:  °· You smoke.   °· You have chronic heart or lung disease. °· You have a weakened defense (immune) system.   °· You are very young or very old.   °· You have nasal allergies or asthma. °· You work in crowded or poorly ventilated areas. °· You work in health care facilities or schools. °SIGNS AND SYMPTOMS  °Symptoms typically develop 2-3 days after you come in contact with a cold virus. Most viral URIs last 7-10 days. However, viral URIs from the influenza virus (flu virus) can last 14-18 days and are typically more severe. Symptoms may include:  °· Runny or stuffy (congested) nose.   °· Sneezing.   °· Cough.   °· Sore throat.   °· Headache.   °· Fatigue.   °· Fever.   °· Loss of appetite.   °· Pain in your forehead, behind your eyes, and over your cheekbones (sinus pain). °· Muscle aches.   °DIAGNOSIS  °Your health care provider may diagnose a URI by: °· Physical exam. °· Tests to check that your symptoms are not due to  another condition such as: °· Strep throat. °· Sinusitis. °· Pneumonia. °· Asthma. °TREATMENT  °A URI goes away on its own with time. It cannot be cured with medicines, but medicines may be prescribed or recommended to relieve symptoms. Medicines may help: °· Reduce your fever. °· Reduce your cough. °· Relieve nasal congestion. °HOME CARE INSTRUCTIONS  °· Take medicines only as directed by your health care provider.   °· Gargle warm saltwater or take cough drops to comfort your throat as directed by your health care provider. °· Use a warm mist humidifier or inhale steam from a shower to increase air moisture. This may make it easier to breathe. °· Drink enough fluid to keep your urine clear or pale yellow.   °· Eat soups and other clear broths and maintain good nutrition.   °· Rest as needed.   °· Return to work when your temperature has returned to normal or as your health care provider advises. You may need to stay home longer to avoid infecting others. You can also use a face mask and careful hand washing to prevent spread of the virus. °· Increase the usage of your inhaler if you have asthma.   °· Do not use any tobacco products, including cigarettes, chewing tobacco, or electronic cigarettes. If you need help quitting, ask your health care provider. °PREVENTION  °The best way to protect yourself from getting a cold is to practice good hygiene.  °· Avoid oral or hand contact with people with cold   symptoms.   Wash your hands often if contact occurs.  There is no clear evidence that vitamin C, vitamin E, echinacea, or exercise reduces the chance of developing a cold. However, it is always recommended to get plenty of rest, exercise, and practice good nutrition.  SEEK MEDICAL CARE IF:   You are getting worse rather than better.   Your symptoms are not controlled by medicine.   You have chills.  You have worsening shortness of breath.  You have brown or red mucus.  You have yellow or brown nasal  discharge.  You have pain in your face, especially when you bend forward.  You have a fever.  You have swollen neck glands.  You have pain while swallowing.  You have white areas in the back of your throat. SEEK IMMEDIATE MEDICAL CARE IF:   You have severe or persistent:  Headache.  Ear pain.  Sinus pain.  Chest pain.  You have chronic lung disease and any of the following:  Wheezing.  Prolonged cough.  Coughing up blood.  A change in your usual mucus.  You have a stiff neck.  You have changes in your:  Vision.  Hearing.  Thinking.  Mood. MAKE SURE YOU:   Understand these instructions.  Will watch your condition.  Will get help right away if you are not doing well or get worse.   This information is not intended to replace advice given to you by your health care provider. Make sure you discuss any questions you have with your health care provider.   Document Released: 04/23/2001 Document Revised: 03/14/2015 Document Reviewed: 02/02/2014 Elsevier Interactive Patient Education 2016 [employer]    Pharyngitis Pharyngitis is a sore throat (pharynx). There is redness, pain, and swelling of your throat. HOME CARE   Drink enough fluids to keep your pee (urine) clear or pale yellow.  Only take medicine as told by your doctor.  You may get sick again if you do not take medicine as told. Finish your medicines, even if you start to feel better.  Do not take aspirin.  Rest.  Rinse your mouth (gargle) with salt water ( tsp of salt per 1 qt of water) every 1-2 hours. This will help the pain.  If you are not at risk for choking, you can suck on hard candy or sore throat lozenges. GET HELP IF:  You have large, tender lumps on your neck.  You have a rash.  You cough up green, yellow-brown, or bloody spit. GET HELP RIGHT AWAY IF:   You have a stiff neck.  You drool or cannot swallow liquids.  You throw up (vomit) or are not able to keep  medicine or liquids down.  You have very bad pain that does not go away with medicine.  You have problems breathing (not from a stuffy nose). MAKE SURE YOU:   Understand these instructions.  Will watch your condition.  Will get help right away if you are not doing well or get worse.   This information is not intended to replace advice given to you by your health care provider. Make sure you discuss any questions you have with your health care provider.   Document Released: 04/15/2008 Document Revised: 08/18/2013 Document Reviewed: 07/05/2013 Elsevier Interactive Patient Education 2016 [employer].   Use an over the counter decongestant (such as sudafed) along with tylenol or ibuprofen for fever and body aches.

## 2016-01-05 LAB — CULTURE, GROUP A STREP (THRC)

## 2016-03-27 ENCOUNTER — Encounter (HOSPITAL_COMMUNITY): Payer: Self-pay | Admitting: Emergency Medicine

## 2016-03-27 ENCOUNTER — Emergency Department (HOSPITAL_COMMUNITY)
Admission: EM | Admit: 2016-03-27 | Discharge: 2016-03-27 | Disposition: A | Discharge status: Home / Self Care | Payer: BLUE CROSS/BLUE SHIELD | Attending: Emergency Medicine | Admitting: Emergency Medicine

## 2016-03-27 DIAGNOSIS — Z79891 Long term (current) use of opiate analgesic: Secondary | ICD-10-CM | POA: Diagnosis not present

## 2016-03-27 DIAGNOSIS — Z792 Long term (current) use of antibiotics: Secondary | ICD-10-CM | POA: Insufficient documentation

## 2016-03-27 DIAGNOSIS — Z79899 Other long term (current) drug therapy: Secondary | ICD-10-CM | POA: Diagnosis not present

## 2016-03-27 DIAGNOSIS — B9689 Other specified bacterial agents as the cause of diseases classified elsewhere: Secondary | ICD-10-CM | POA: Diagnosis not present

## 2016-03-27 DIAGNOSIS — Z791 Long term (current) use of non-steroidal anti-inflammatories (NSAID): Secondary | ICD-10-CM | POA: Insufficient documentation

## 2016-03-27 DIAGNOSIS — N898 Other specified noninflammatory disorders of vagina: Secondary | ICD-10-CM | POA: Diagnosis present

## 2016-03-27 DIAGNOSIS — N760 Acute vaginitis: Secondary | ICD-10-CM | POA: Diagnosis not present

## 2016-03-27 LAB — URINALYSIS, ROUTINE W REFLEX MICROSCOPIC
Glucose, UA: NEGATIVE mg/dL
Hgb urine dipstick: NEGATIVE
KETONES UR: NEGATIVE mg/dL
LEUKOCYTES UA: NEGATIVE
NITRITE: NEGATIVE
PROTEIN: NEGATIVE mg/dL
Specific Gravity, Urine: 1.031 — ABNORMAL HIGH (ref 1.005–1.030)
pH: 6 (ref 5.0–8.0)

## 2016-03-27 LAB — WET PREP, GENITAL
Sperm: NONE SEEN
TRICH WET PREP: NONE SEEN
YEAST WET PREP: NONE SEEN

## 2016-03-27 LAB — POC URINE PREG, ED: PREG TEST UR: NEGATIVE

## 2016-03-27 MED ORDER — METRONIDAZOLE 0.75 % VA GEL: 1.0000 | Freq: Two times a day (BID) | VAGINAL | Status: AC

## 2016-03-27 NOTE — ED Provider Notes (Signed)
CSN: [id number]     Arrival date & time 03/27/16  2015 History  By signing my name below, I, Tanda Rockers, attest that this documentation has been prepared under the direction and in the presence of [employer], PA-C.  Electronically Signed: Tanda Rockers, ED Scribe. 03/27/2016. 10:25 PM.   Chief Complaint  Patient presents with  . Vaginal Discharge   The history is provided by the patient. No language interpreter was used.    HPI Comments: Marie Howell is a 30 y.o. female who presents to the Emergency Department complaining of gradual onset, intermittent, lower abdominal pain for the past 2 years, with the most recent episode starting 2 weeks ago. Pt mentions having unprotected intercourse in 2015 and has been having intermittent lower abdominal pain since. She also complains of intermittent vaginal discharge since 2015. Pt has not followed up with an OBGYN for these symptoms. 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 Pt tested negative for all other STDs. She does not take medications for the herpes and has never taken them during outbreaks. Pt presents to the ED tonight for a malodorous smell to the vaginal area which is new and concerning her. Denies nausea, vomiting, fever, or any other associated symptoms. No previous ultrasounds.    Past Medical History  Diagnosis Date  . UTI (lower urinary tract infection)   . Lyme disease   . PID (acute pelvic inflammatory disease)    History reviewed. No pertinent past surgical history. No family history on file. Social History  Substance Use Topics  . Smoking status: Never Smoker   . Smokeless tobacco: Never Used  . Alcohol Use: No   OB History    No data available     Review of Systems  Constitutional: Negative for fever.  Gastrointestinal: Positive for abdominal pain. Negative for nausea and vomiting.  Genitourinary: Positive for vaginal discharge.  All other systems reviewed and are  negative.  Allergies  Monistat and Penicillins  Home Medications   Prior to Admission medications   Medication Sig Start Date End Date Taking? Authorizing Provider  levonorgestrel (PLAN B,NEXT CHOICE) 0.75 MG tablet Take 0.75 mg by mouth once.   Yes Historical Provider, MD  acetaminophen (TYLENOL) 500 MG tablet Take 500 mg by mouth every 6 (six) hours as needed for mild pain or moderate pain.    Historical Provider, MD  HYDROcodone-acetaminophen (NORCO/VICODIN) 5-325 MG tablet Take 1 tablet by mouth every 4 (four) hours as needed. Patient not taking: Reported on 03/27/2016 12/02/15   Mady Gemma, PA-C  ibuprofen (ADVIL,MOTRIN) 800 MG tablet Take 1 tablet (800 mg total) by mouth 3 (three) times daily. Patient not taking: Reported on 03/27/2016 12/02/15   Mady Gemma, PA-C  metroNIDAZOLE (METROGEL VAGINAL) 0.75 % vaginal gel Place 1 Applicatorful vaginally 2 (two) times daily. 03/27/16   Antony Madura, PA-C  oxyCODONE-acetaminophen (PERCOCET/ROXICET) 5-325 MG tablet Take 1-2 tablets by mouth every 4 (four) hours as needed for severe pain. Patient not taking: Reported on 03/27/2016 09/16/15   Donnetta Hutching, MD  promethazine (PHENERGAN) 25 MG tablet Take 1 tablet (25 mg total) by mouth every 6 (six) hours as needed for nausea or vomiting. 09/16/15   Donnetta Hutching, MD   BP 115/88 mmHg  Pulse 98  Temp(Src) 98.1 F (36.7 C) (Oral)  Resp 18  SpO2 100%  LMP 02/22/2016   Physical Exam  Constitutional: She is oriented to person, place, and time. She appears well-developed and well-nourished. No  distress.  Nontoxic/nonseptic appearing  HENT:  Head: Normocephalic and atraumatic.  Eyes: Conjunctivae and EOM are normal. No scleral icterus.  Neck: Normal range of motion.  Cardiovascular: Normal rate, regular rhythm and intact distal pulses.   Pulmonary/Chest: Effort normal. No respiratory distress. She has no wheezes.  Abdominal: Soft. She exhibits no distension. There is no tenderness. There  is no rebound and no guarding.  Soft, nontender abdomen. No masses. No peritoneal signs.  Genitourinary: There is no rash, tenderness, lesion or injury on the right labia. There is no rash, tenderness, lesion or injury on the left labia. Uterus is not tender. Cervix exhibits no motion tenderness, no discharge and no friability. Right adnexum displays no mass, no tenderness and no fullness. Left adnexum displays no mass, no tenderness and no fullness. No bleeding in the vagina. Vaginal discharge (mild, white, non-odorous) found.  Mild amount of white discharge. No cervical friability. No motion tenderness.  Musculoskeletal: Normal range of motion.  Neurological: She is alert and oriented to person, place, and time.  Skin: Skin is warm and dry. No rash noted. She is not diaphoretic. No erythema. No pallor.  Psychiatric: She has a normal mood and affect. Her behavior is normal.  Nursing note and vitals reviewed.   ED Course  Procedures (including critical care time)  DIAGNOSTIC STUDIES: Oxygen Saturation is 100% on RA, normal by my interpretation.    COORDINATION OF CARE: 10:23 PM-Discussed treatment plan which includes pelvic exam with pt at bedside and pt agreed to plan.   Labs Review Labs Reviewed  WET PREP, GENITAL - Abnormal; Notable for the following:    Clue Cells Wet Prep HPF POC PRESENT (*)    WBC, Wet Prep HPF POC MANY (*)    All other components within normal limits  URINALYSIS, ROUTINE W REFLEX MICROSCOPIC (NOT AT [facility]) - Abnormal; Notable for the following:    APPearance CLOUDY (*)    Specific Gravity, Urine 1.031 (*)    Bilirubin Urine SMALL (*)    All other components within normal limits  POC URINE PREG, ED  GC/CHLAMYDIA PROBE AMP ([place]) NOT AT [facility]    Imaging Review No results found.   I have personally reviewed and evaluated these images and lab results as part of my medical decision-making.   EKG Interpretation None      MDM   Final diagnoses:   Bacterial vaginosis    30 year old female presents to the emergency department for evaluation of vaginal discharge. This has been associated with lower abdominal pain; however, she has had similar pain intermittently over the past 2 years. She has no pain at present with a soft and unremarkable abdominal exam. She has no cervical motion tenderness or adnexal tenderness. No cervical friability. Low suspicion for emergent etiology. Doubt TOA or ovarian torsion.  Urinalysis does not suggest infection. Urine pregnancy is negative today. Wet prep is consistent with bacterial vaginosis. Given reassuring physical exam, no indication for STD prophylaxis. I have recommended that the patient follow-up on the results of her STD tests in 48 hours. She has been referred to an OB/GYN for further outpatient management. Have discussed the possibility an outpatient ultrasound given 2 years of intermittent pain. Return precautions discussed and provided. Patient discharged in satisfactory condition with no unaddressed concerns.  I personally performed the services described in this documentation, which was scribed in my presence. The recorded information has been reviewed and is accurate.    Filed Vitals:   03/27/16 2038 03/27/16 2339  BP: 114/77 115/88  Pulse: 81 98  Temp: 99.2 F (37.3 C) 98.1 F (36.7 C)  TempSrc: Oral Oral  Resp: 20 18  SpO2: 100% 100%        Antony MaduraKelly Voncille Simm, PA-C 03/28/16 0013  Benjiman CoreNathan Pickering, MD 03/28/16 [phone]

## 2016-03-27 NOTE — ED Notes (Signed)
Pt c/o malodorous white/yellow vaginal discharge with intermittent low mid abd pain x 2 weeks. Denies fever, denies n/v/d, denies dysuria.

## 2016-03-27 NOTE — Progress Notes (Signed)
Patient listed as having [employer] insurance without a pcp.  EDCM spoke to patient at bedside.  EDCM provided patient with list of pcps who accept BCBS insurance within a 20 mile radius of patient's zip code.  [facility] to find a [facility] on the list and establish care ASAP.  Patient verbalized understanding.  No further EDCM needs at this time.

## 2016-03-27 NOTE — Discharge Instructions (Signed)

## 2016-03-28 LAB — GC/CHLAMYDIA PROBE AMP (~~LOC~~) NOT AT ARMC
Chlamydia: NEGATIVE
Neisseria Gonorrhea: NEGATIVE

## 2016-05-14 ENCOUNTER — Emergency Department (HOSPITAL_COMMUNITY)
Admission: EM | Admit: 2016-05-14 | Discharge: 2016-05-15 | Disposition: A | Discharge status: Home / Self Care | Payer: BLUE CROSS/BLUE SHIELD | Attending: Emergency Medicine | Admitting: Emergency Medicine

## 2016-05-14 ENCOUNTER — Encounter (HOSPITAL_COMMUNITY): Payer: Self-pay | Admitting: *Deleted

## 2016-05-14 DIAGNOSIS — Y9389 Activity, other specified: Secondary | ICD-10-CM | POA: Insufficient documentation

## 2016-05-14 DIAGNOSIS — S39012A Strain of muscle, fascia and tendon of lower back, initial encounter: Secondary | ICD-10-CM | POA: Diagnosis not present

## 2016-05-14 DIAGNOSIS — X58XXXA Exposure to other specified factors, initial encounter: Secondary | ICD-10-CM | POA: Insufficient documentation

## 2016-05-14 DIAGNOSIS — Y990 Civilian activity done for income or pay: Secondary | ICD-10-CM | POA: Diagnosis not present

## 2016-05-14 DIAGNOSIS — Y92242 Post office as the place of occurrence of the external cause: Secondary | ICD-10-CM | POA: Diagnosis not present

## 2016-05-14 DIAGNOSIS — S3992XA Unspecified injury of lower back, initial encounter: Secondary | ICD-10-CM | POA: Diagnosis present

## 2016-05-14 DIAGNOSIS — Z79899 Other long term (current) drug therapy: Secondary | ICD-10-CM | POA: Insufficient documentation

## 2016-05-14 DIAGNOSIS — S29012A Strain of muscle and tendon of back wall of thorax, initial encounter: Secondary | ICD-10-CM

## 2016-05-14 MED ORDER — CYCLOBENZAPRINE HCL 10 MG PO TABS
5.0000 mg | ORAL_TABLET | Freq: Once | ORAL | Status: AC
  Administered 2016-05-15: 5 mg via ORAL
  Filled 2016-05-14: qty 1

## 2016-05-14 MED ORDER — IBUPROFEN 800 MG PO TABS
800.0000 mg | ORAL_TABLET | Freq: Once | ORAL | Status: AC
  Administered 2016-05-15: 800 mg via ORAL
  Filled 2016-05-14: qty 1

## 2016-05-14 NOTE — ED Provider Notes (Signed)
CSN: [id number]     Arrival date & time 05/14/16  2228 History  By signing my name below, I, Bridgette HabermannMaria Tan, attest that this documentation has been prepared under the direction and in the presence of Fayrene HelperBowie Adir Schicker, PA-C. Electronically Signed: Bridgette HabermannMaria Tan, ED Scribe. 05/14/2016. 11:37 PM.   Chief Complaint  Patient presents with  . Back Pain   The history is provided by the patient. No language interpreter was used.    HPI Comments: Marie Howell is a [age] y.o. female who presents to the Emergency Department complaining of 4/10, sudden onset, constant, left-sided back pain. 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 Pt states pain is exacerbated when breathing, movement, and laying on her back. She reports that laying on her side greatly improves the pain. She notes that she had experienced shortness of breath PTA and had to sit down but is resolved now.  Pt has no difficulty breathing at this time. Pt has had this pain before and notes that this is a little worse. Pt denies any injury at her job but notes that it involves a lot of moving and lifting. Pt has no history of cancer. She has no chance of being pregnant. Pt denies chest pain, abd pain, cough, nausea, vomiting, leg swelling. Pt denies any recent travel and sick contact.    Past Medical History  Diagnosis Date  . UTI (lower urinary tract infection)   . Lyme disease   . PID (acute pelvic inflammatory disease)    History reviewed. No pertinent past surgical history. No family history on file. Social History  Substance Use Topics  . Smoking status: Never Smoker   . Smokeless tobacco: Never Used  . Alcohol Use: No   OB History    No data available     Review of Systems  Respiratory: Negative for cough and shortness of breath.   Cardiovascular: Negative for chest pain and leg swelling.  Gastrointestinal: Negative for nausea and vomiting.  Musculoskeletal: Positive for back  pain.  All other systems reviewed and are negative.   Allergies  Monistat and Penicillins  Home Medications   Prior to Admission medications   Medication Sig Start Date End Date Taking? Authorizing Provider  acetaminophen (TYLENOL) 500 MG tablet Take 500 mg by mouth every 6 (six) hours as needed for mild pain or moderate pain.   Yes Historical Provider, MD  venlafaxine XR (EFFEXOR-XR) 37.5 MG 24 hr capsule Take 1 capsule by mouth daily. 05/10/16  Yes Historical Provider, MD  HYDROcodone-acetaminophen (NORCO/VICODIN) 5-325 MG tablet Take 1 tablet by mouth every 4 (four) hours as needed. Patient not taking: Reported on 03/27/2016 12/02/15   Mady GemmaElizabeth C Westfall, PA-C  ibuprofen (ADVIL,MOTRIN) 800 MG tablet Take 1 tablet (800 mg total) by mouth 3 (three) times daily. Patient not taking: Reported on 03/27/2016 12/02/15   Mady GemmaElizabeth C Westfall, PA-C  metroNIDAZOLE (METROGEL VAGINAL) 0.75 % vaginal gel Place 1 Applicatorful vaginally 2 (two) times daily. Patient not taking: Reported on 05/14/2016 03/27/16   Antony MaduraKelly Humes, PA-C  oxyCODONE-acetaminophen (PERCOCET/ROXICET) 5-325 MG tablet Take 1-2 tablets by mouth every 4 (four) hours as needed for severe pain. Patient not taking: Reported on 03/27/2016 09/16/15   Donnetta HutchingBrian Cook, MD  promethazine (PHENERGAN) 25 MG tablet Take 1 tablet (25 mg total) by mouth every 6 (six) hours as needed for nausea or vomiting. Patient not taking: Reported on 05/14/2016 09/16/15   Donnetta HutchingBrian Cook, MD   BP  121/84 mmHg  Pulse 70  Temp(Src) 98.3 F (36.8 C) (Oral)  Resp 15  Ht 5\' 2"  (1.575 m)  Wt 175 lb (79.379 kg)  BMI 32.00 kg/m2  SpO2 100%  LMP 04/27/2016 Physical Exam  Constitutional: She is oriented to person, place, and time. She appears well-developed and well-nourished.  HENT:  Head: Normocephalic and atraumatic.  Eyes: Conjunctivae are normal.  Cardiovascular: Normal rate, regular rhythm and normal heart sounds.   Pulmonary/Chest: Effort normal and breath sounds normal.  No respiratory distress.  Abdominal: Soft. She exhibits no distension.  Musculoskeletal: Normal range of motion.  No significant midline spine tenderness, crepitus, or step-offs. Tenderness to left parathoracic spinal muscle on palpation without any bruising, crepitus or overlying skin changes. Worsening pain with lateral rotation.   Neurological: She is alert and oriented to person, place, and time.  Skin: Skin is warm and dry.  No skin rash, no bruising noted.  Psychiatric: She has a normal mood and affect. Her behavior is normal.  Nursing note and vitals reviewed.   ED Course  Procedures  DIAGNOSTIC STUDIES: Oxygen Saturation is 100% on RA, normal by my interpretation.    COORDINATION OF CARE: 11:37 PM Discussed treatment plan with pt at bedside which includes Rx of muscle relaxers and pt agreed to plan.   MDM   Final diagnoses:  Strain of mid-back, initial encounter    BP 119/77 mmHg  Pulse 71  Temp(Src) 98.3 F (36.8 C) (Oral)  Resp 16  Ht 5\' 2"  (1.575 m)  Wt 79.379 kg  BMI 32.00 kg/m2  SpO2 100%  LMP 04/27/2016   I personally performed the services described in this documentation, which was scribed in my presence. The recorded information has been reviewed and is accurate.     12:57 AM Patient was allowed job in recent heavy lifting. She is here with acute onset of pain to her left parathoracic spinal muscle and that is reducible on exam and worsen with movement. Suspect muscle skeletal as source. She is PERC negative, low suspicion for PE. Doubt ACS. No injury warranting advanced imaging at this time. Patient received muscle relaxant and anti-inflammatory medication. She felt better and stable for discharge. Return precaution discussed.  Fayrene HelperBowie Ayat Drenning, PA-C 05/15/16 0100  Tomasita CrumbleAdeleke Oni, MD 05/15/16 [phone]

## 2016-05-14 NOTE — ED Notes (Signed)
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

## 2016-05-15 MED ORDER — IBUPROFEN 800 MG PO TABS: 800.0000 mg | ORAL_TABLET | Freq: Three times a day (TID) | ORAL | Status: AC

## 2016-05-15 MED ORDER — CYCLOBENZAPRINE HCL 10 MG PO TABS: 10.0000 mg | ORAL_TABLET | Freq: Two times a day (BID) | ORAL | Status: AC | PRN

## 2016-05-15 NOTE — ED Notes (Signed)
Pt ambulated without assistance for about 20 feet.

## 2016-05-15 NOTE — Discharge Instructions (Signed)
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
